# Patient Record
Sex: Male | Born: 1955 | Race: White | Hispanic: No | Marital: Married | Smoking: Current every day smoker
Health system: Southern US, Community
[De-identification: ages and names within clinical notes are randomized; demographics above are authoritative.]

## PROBLEM LIST (undated history)

## (undated) DIAGNOSIS — I2510 Atherosclerotic heart disease of native coronary artery without angina pectoris: Secondary | ICD-10-CM

## (undated) DIAGNOSIS — I77819 Aortic ectasia, unspecified site: Secondary | ICD-10-CM

## (undated) DIAGNOSIS — E785 Hyperlipidemia, unspecified: Secondary | ICD-10-CM

## (undated) DIAGNOSIS — I10 Essential (primary) hypertension: Secondary | ICD-10-CM

## (undated) DIAGNOSIS — E669 Obesity, unspecified: Secondary | ICD-10-CM

## (undated) HISTORY — DX: Atherosclerotic heart disease of native coronary artery without angina pectoris: I25.10

## (undated) HISTORY — DX: Aortic ectasia, unspecified site: I77.819

## (undated) HISTORY — DX: Obesity, unspecified: E66.9

## (undated) HISTORY — DX: Essential (primary) hypertension: I10

## (undated) HISTORY — PX: CARDIAC CATHETERIZATION: SHX172

## (undated) HISTORY — PX: KNEE SURGERY: SHX244

## (undated) HISTORY — DX: Hyperlipidemia, unspecified: E78.5

---

## 2001-02-05 ENCOUNTER — Emergency Department (HOSPITAL_COMMUNITY): Admission: EM | Admit: 2001-02-05 | Discharge: 2001-02-05 | Payer: Self-pay | Admitting: Emergency Medicine

## 2001-02-05 ENCOUNTER — Encounter: Payer: Self-pay | Admitting: Emergency Medicine

## 2003-11-01 ENCOUNTER — Ambulatory Visit (HOSPITAL_COMMUNITY): Admission: RE | Admit: 2003-11-01 | Discharge: 2003-11-01 | Payer: Self-pay | Admitting: Family Medicine

## 2005-04-17 ENCOUNTER — Ambulatory Visit (HOSPITAL_COMMUNITY): Admission: RE | Admit: 2005-04-17 | Discharge: 2005-04-17 | Payer: Self-pay | Admitting: Family Medicine

## 2006-10-03 ENCOUNTER — Ambulatory Visit: Payer: Self-pay | Admitting: Orthopedic Surgery

## 2008-11-15 ENCOUNTER — Ambulatory Visit: Payer: Self-pay | Admitting: Cardiology

## 2008-11-15 ENCOUNTER — Inpatient Hospital Stay (HOSPITAL_COMMUNITY): Admission: AD | Admit: 2008-11-15 | Discharge: 2008-11-17 | Payer: Self-pay | Admitting: Cardiology

## 2008-11-30 ENCOUNTER — Ambulatory Visit: Payer: Self-pay | Admitting: Cardiology

## 2009-01-17 DIAGNOSIS — E1169 Type 2 diabetes mellitus with other specified complication: Secondary | ICD-10-CM | POA: Insufficient documentation

## 2009-01-17 DIAGNOSIS — E785 Hyperlipidemia, unspecified: Secondary | ICD-10-CM | POA: Insufficient documentation

## 2009-01-17 DIAGNOSIS — I10 Essential (primary) hypertension: Secondary | ICD-10-CM | POA: Insufficient documentation

## 2009-01-17 DIAGNOSIS — E663 Overweight: Secondary | ICD-10-CM | POA: Insufficient documentation

## 2009-01-17 DIAGNOSIS — I2510 Atherosclerotic heart disease of native coronary artery without angina pectoris: Secondary | ICD-10-CM | POA: Insufficient documentation

## 2009-08-29 ENCOUNTER — Emergency Department (HOSPITAL_COMMUNITY): Admission: EM | Admit: 2009-08-29 | Discharge: 2009-08-29 | Payer: Self-pay | Admitting: Emergency Medicine

## 2010-02-15 ENCOUNTER — Ambulatory Visit: Payer: Self-pay | Admitting: Cardiology

## 2010-06-18 ENCOUNTER — Encounter: Payer: Self-pay | Admitting: Family Medicine

## 2010-08-16 LAB — DIFFERENTIAL
Basophils Absolute: 0.1 10*3/uL (ref 0.0–0.1)
Basophils Relative: 1 % (ref 0–1)
Eosinophils Absolute: 0.4 10*3/uL (ref 0.0–0.7)
Eosinophils Relative: 3 % (ref 0–5)
Lymphocytes Relative: 37 % (ref 12–46)
Lymphs Abs: 4.2 10*3/uL — ABNORMAL HIGH (ref 0.7–4.0)
Monocytes Absolute: 0.6 10*3/uL (ref 0.1–1.0)
Monocytes Relative: 5 % (ref 3–12)
Neutro Abs: 6.1 10*3/uL (ref 1.7–7.7)
Neutrophils Relative %: 54 % (ref 43–77)

## 2010-08-16 LAB — CBC
HCT: 42.6 % (ref 39.0–52.0)
Hemoglobin: 14.9 g/dL (ref 13.0–17.0)
MCHC: 35 g/dL (ref 30.0–36.0)
MCV: 87.2 fL (ref 78.0–100.0)
Platelets: 267 10*3/uL (ref 150–400)
RBC: 4.89 MIL/uL (ref 4.22–5.81)
RDW: 13.7 % (ref 11.5–15.5)
WBC: 11.3 10*3/uL — ABNORMAL HIGH (ref 4.0–10.5)

## 2010-08-16 LAB — PROTIME-INR: Prothrombin Time: 12.7 seconds (ref 11.6–15.2)

## 2010-08-18 ENCOUNTER — Encounter: Payer: Self-pay | Admitting: Gastroenterology

## 2010-08-25 ENCOUNTER — Encounter: Payer: Self-pay | Admitting: Gastroenterology

## 2010-08-27 ENCOUNTER — Other Ambulatory Visit: Payer: Self-pay | Admitting: Cardiology

## 2010-08-28 ENCOUNTER — Ambulatory Visit (INDEPENDENT_AMBULATORY_CARE_PROVIDER_SITE_OTHER): Payer: PRIVATE HEALTH INSURANCE | Admitting: Gastroenterology

## 2010-08-28 ENCOUNTER — Encounter: Payer: Self-pay | Admitting: Gastroenterology

## 2010-08-28 VITALS — BP 115/71 | HR 60 | Temp 97.9°F | Ht 72.0 in | Wt 233.5 lb

## 2010-08-28 DIAGNOSIS — R1032 Left lower quadrant pain: Secondary | ICD-10-CM

## 2010-08-28 NOTE — Progress Notes (Signed)
Referring Provider: Dr. Phillips Odor Primary Care Physician:  Colette Ribas, MD  Chief Complaint  Patient presents with  . Abdominal Pain    LLQ    HPI:  [name] is a 55 y.o. male here as a referral from Dr. Phillips Odor secondary to LLQ abdominal pain.  He has never had a colonoscopy. Reports LLQ, constant, "dull pain" that extends from level of umbilicus and radiates to his left groin. Complains of testicular discomfort with palpation; states main reason he is here is because he is in discomfort for several days after intercourse with his wife. He states this has been going on for a few years. Has BM 3-4X/week, occasionally constipated. Feels somewhat better after a BM, but LLQ pain returns shortly thereafter. Denies melena or brbpr. Does have decreased appetite X 1 year. Eats a few bites, then is done. Denies any fever/chills. Supposedly has appt with urology upcoming.   Past Medical History  Diagnosis Date  . Hypertension   . Obesity   . Hyperlipidemia   . CAD (coronary artery disease)   . Diabetes mellitus     Past Surgical History  Procedure Date  . Cardiac catheterization     one stent placed  . Knee surgery     Right    Current Outpatient Prescriptions  Medication Sig Dispense Refill  . aspirin 325 MG EC tablet Take 325 mg by mouth daily.        . clopidogrel (PLAVIX) 75 MG tablet Take 75 mg by mouth daily.        . metFORMIN (GLUCOPHAGE) 500 MG tablet Take 500 mg by mouth 2 (two) times daily with a meal.        . metoprolol tartrate (LOPRESSOR) 25 MG tablet Take 25 mg by mouth. TAKE HALF TABLET BY MOUTH 2 TIMES A DAY       . metoprolol-hydrochlorothiazide (LOPRESSOR HCT) 50-25 MG per tablet Take 1 tablet by mouth. TAKE ONE HALF 2 TIMES A DAY       . nitroGLYCERIN (NITROSTAT) 0.4 MG SL tablet Place 0.4 mg under the tongue every 5 (five) minutes as needed.        . pravastatin (PRAVACHOL) 40 MG tablet Take 40 mg by mouth at bedtime.        . ranitidine (ZANTAC) 150 MG  tablet Take 150 mg by mouth 2 (two) times daily.        . rosuvastatin (CRESTOR) 40 MG tablet Take 40 mg by mouth at bedtime.          Allergies as of 08/28/2010  . (No Known Allergies)    Family History  Problem Relation Age of Onset  . Diabetes Mother   . Cancer Father     UNSURE WHAT TYPE    History   Social History  . Marital Status: Married    Social History Main Topics  . Smoking status: Current Everyday Smoker -- 2.0 packs/day for 35 years    Types: Cigarettes  . Smokeless tobacco: Never Used  . Alcohol Use: 0.5 oz/week    1 drink(s) per week     one half of a fifth of crown on saturday nights  . Drug Use: No  . Sexually Active: No     erectile dysfunction   Review of Systems: Gen: Denies any fever, chills, sweats, anorexia CV: Denies chest pain, angina, palpitations, syncope, orthopnea Resp: Denies dyspnea at rest, wheezing, coughing up blood, and pleurisy. GI: SEE HPI GU :c/o urinary hesitancy, denies blood in  urine MS: Denies joint pain, limitation of movement, and swelling, stiffness, low back pain, extremity pain. Derm: Denies rash, itching, dry skin Psych: Denies depression, anxiety, memory loss Heme: Denies bruising, bleeding, and enlarged lymph nodes.  Physical Exam: BP 115/71  Pulse 60  Temp 97.9 F (36.6 C)  Ht 6' (1.829 m)  Wt 233 lb 8 oz (105.915 kg)  BMI 31.67 kg/m2 General:   Head:  Normocephalic and atraumatic. Eyes:  Sclera clear, no icterus.   Conjunctiva pink. Ears:  Normal auditory acuity. Nose:  No deformity, discharge,  or lesions. Mouth:  No deformity or lesions, dentition normal. Neck:  Supple; no masses or thyromegaly. Lungs:  Clear to auscultation bilaterally, without wheezes, rales, or rhonchi. No use of accessory muscles. No labored breathing Heart:  S1, S2 present. No murmurs, rubs, or gallops Abdomen: +BS, soft, mild tenderness to palpation LLQ. No rebound or guarding. No HSM noted.  Rectal:  Deferred until time of  colonoscopy.   Msk:  Symmetrical without gross deformities. Normal posture. Extremities:  Without clubbing or edema. Neurologic:  Alert and  oriented x4;  grossly normal neurologically. Skin:  Intact without significant lesions or rashes. Psych:  Alert and cooperative. Normal mood and affect.

## 2010-08-28 NOTE — Assessment & Plan Note (Signed)
55 year old Caucasian male with several year history of LLQ abdominal pain extending from left of umbilicus down to groin; significantly worsened after intercourse, lasting in severity for several days. Described as constant, "dull" pain. BM 3-4X per week without signs of melena or brbpr. Slight improvement in symptoms after BM; pain returns soon thereafter. No prior colonoscopy.  1. Will need screening colonoscopy in near future: the R/B/A have been discussed in detail; pt states understanding and desires to proceed. Hold metformin evening before procedure and morning of.  2. CT of abd/pelvis in next few days before colonoscopy to assess for other etiology as well; Cr will be drawn. Metformin to be held X 48 hours post scan.  3. High fiber diet, handout given. Miralax as needed daily for constipation.  4. Follow-up with urology and PCP for further management and recommendations regarding testicular discomfort.

## 2010-08-28 NOTE — Patient Instructions (Addendum)
Complete CT of your abdomen. We will call with results. Make sure you keep your urology appointment. Contact your primary care if you have questions of when this is. We have set you up for a colonoscopy. Eat a high fiber diet You may take miralax as needed daily for constipation.   CT scan April 3rd at 5:30 pm. Follow instructions on handout. STOP metformin April 3rd. You may resume metformin on April 5th.  Colonoscopy is scheduled for April 10th. Follow instructions on handout. Do not take metformin April 9th evening or April 10th morning. After colonoscopy, you may resume your routine medications.

## 2010-08-29 ENCOUNTER — Encounter (HOSPITAL_COMMUNITY): Payer: Self-pay

## 2010-08-29 ENCOUNTER — Ambulatory Visit (HOSPITAL_COMMUNITY)
Admission: RE | Admit: 2010-08-29 | Discharge: 2010-08-29 | Disposition: A | Discharge status: Home / Self Care | Payer: 59 | Source: Ambulatory Visit | Attending: Gastroenterology | Admitting: Gastroenterology

## 2010-08-29 DIAGNOSIS — R1032 Left lower quadrant pain: Secondary | ICD-10-CM

## 2010-08-29 MED ORDER — IOHEXOL 300 MG/ML  SOLN
100.0000 mL | Freq: Once | INTRAMUSCULAR | Status: AC | PRN
  Administered 2010-08-29: 100 mL via INTRAVENOUS

## 2010-08-29 NOTE — Progress Notes (Signed)
Discomfort in the LLQ after sex is not usually GI in nature. Agree with CT Scan and TCS. Follow up with urology.

## 2010-08-30 NOTE — Progress Notes (Signed)
Addendum: LLQ pain differentials to include: chronic prostatitis, functional abd pain, musculoskeletal abdominal wall pain. Less likely colon cancer or diverticulitis. Will proceed with CT to assess for other etiology, followed by TCS as pt has never undergone screening before. , and less likley colon cancer, or diverticulitis. Plan CT followed by TCS.

## 2010-09-04 ENCOUNTER — Ambulatory Visit (HOSPITAL_COMMUNITY)
Admission: RE | Admit: 2010-09-04 | Discharge: 2010-09-04 | Disposition: A | Discharge status: Home / Self Care | Payer: PRIVATE HEALTH INSURANCE | Source: Ambulatory Visit | Attending: Gastroenterology | Admitting: Gastroenterology

## 2010-09-04 ENCOUNTER — Encounter: Payer: PRIVATE HEALTH INSURANCE | Admitting: Gastroenterology

## 2010-09-04 ENCOUNTER — Other Ambulatory Visit: Payer: Self-pay | Admitting: Gastroenterology

## 2010-09-04 DIAGNOSIS — R109 Unspecified abdominal pain: Secondary | ICD-10-CM

## 2010-09-04 DIAGNOSIS — K648 Other hemorrhoids: Secondary | ICD-10-CM

## 2010-09-04 DIAGNOSIS — I10 Essential (primary) hypertension: Secondary | ICD-10-CM | POA: Insufficient documentation

## 2010-09-04 DIAGNOSIS — Z7982 Long term (current) use of aspirin: Secondary | ICD-10-CM | POA: Insufficient documentation

## 2010-09-04 DIAGNOSIS — E785 Hyperlipidemia, unspecified: Secondary | ICD-10-CM | POA: Insufficient documentation

## 2010-09-04 DIAGNOSIS — K5730 Diverticulosis of large intestine without perforation or abscess without bleeding: Secondary | ICD-10-CM | POA: Insufficient documentation

## 2010-09-04 DIAGNOSIS — D126 Benign neoplasm of colon, unspecified: Secondary | ICD-10-CM

## 2010-09-04 DIAGNOSIS — E119 Type 2 diabetes mellitus without complications: Secondary | ICD-10-CM | POA: Insufficient documentation

## 2010-09-04 DIAGNOSIS — Z1211 Encounter for screening for malignant neoplasm of colon: Secondary | ICD-10-CM | POA: Insufficient documentation

## 2010-09-04 DIAGNOSIS — Z79899 Other long term (current) drug therapy: Secondary | ICD-10-CM | POA: Insufficient documentation

## 2010-09-04 LAB — CBC
Hemoglobin: 14.7 g/dL (ref 13.0–17.0)
MCHC: 33.6 g/dL (ref 30.0–36.0)
MCHC: 33.8 g/dL (ref 30.0–36.0)
MCV: 90.9 fL (ref 78.0–100.0)
MCV: 91.4 fL (ref 78.0–100.0)
Platelets: 274 10*3/uL (ref 150–400)
RBC: 4.72 MIL/uL (ref 4.22–5.81)
RBC: 4.78 MIL/uL (ref 4.22–5.81)
RDW: 13.7 % (ref 11.5–15.5)
WBC: 11.1 10*3/uL — ABNORMAL HIGH (ref 4.0–10.5)
WBC: 14.8 10*3/uL — ABNORMAL HIGH (ref 4.0–10.5)

## 2010-09-04 LAB — BASIC METABOLIC PANEL
CO2: 24 mEq/L (ref 19–32)
Calcium: 8.6 mg/dL (ref 8.4–10.5)
Calcium: 8.8 mg/dL (ref 8.4–10.5)
Creatinine, Ser: 0.78 mg/dL (ref 0.4–1.5)
GFR calc Af Amer: >60 mL/min (ref >60)
GFR calc Af Amer: >60 mL/min (ref >60)
GFR calc non Af Amer: >60 mL/min (ref >60)
GFR calc non Af Amer: >60 mL/min (ref >60)
Glucose, Bld: 219 mg/dL — ABNORMAL HIGH (ref 70–99)
Glucose, Bld: 231 mg/dL — ABNORMAL HIGH (ref 70–99)
Potassium: 4 mEq/L (ref 3.5–5.1)
Sodium: 137 mEq/L (ref 135–145)

## 2010-09-04 LAB — GLUCOSE, CAPILLARY
Glucose-Capillary: 208 mg/dL — ABNORMAL HIGH (ref 70–99)
Glucose-Capillary: 126 mg/dL — ABNORMAL HIGH (ref 70–99)
Glucose-Capillary: 177 mg/dL — ABNORMAL HIGH (ref 70–99)
Glucose-Capillary: 191 mg/dL — ABNORMAL HIGH (ref 70–99)
Glucose-Capillary: 218 mg/dL — ABNORMAL HIGH (ref 70–99)
Glucose-Capillary: 218 mg/dL — ABNORMAL HIGH (ref 70–99)
Glucose-Capillary: 219 mg/dL — ABNORMAL HIGH (ref 70–99)
Glucose-Capillary: 244 mg/dL — ABNORMAL HIGH (ref 70–99)
Glucose-Capillary: 293 mg/dL — ABNORMAL HIGH (ref 70–99)

## 2010-09-04 LAB — DIFFERENTIAL
Eosinophils Absolute: 0.3 10*3/uL (ref 0.0–0.7)
Lymphocytes Relative: 27 % (ref 12–46)
Lymphs Abs: 3 10*3/uL (ref 0.7–4.0)
Monocytes Relative: 6 % (ref 3–12)
Neutro Abs: 7.1 10*3/uL (ref 1.7–7.7)
Neutrophils Relative %: 64 % (ref 43–77)

## 2010-09-04 LAB — TSH: TSH: 1.665 u[IU]/mL (ref 0.350–4.500)

## 2010-09-04 LAB — CARDIAC PANEL(CRET KIN+CKTOT+MB+TROPI)
CK, MB: 0.9 ng/mL (ref 0.3–4.0)
Relative Index: INVALID (ref 0.0–2.5)
Troponin I: 0.14 ng/mL — ABNORMAL HIGH (ref 0.00–0.06)
Troponin I: 0.16 ng/mL — ABNORMAL HIGH (ref 0.00–0.06)

## 2010-09-04 LAB — HEPARIN LEVEL (UNFRACTIONATED)
Heparin Unfractionated: 0.14 [IU]/mL — ABNORMAL LOW (ref 0.30–0.70)
Heparin Unfractionated: <0.1 IU/mL — ABNORMAL LOW (ref 0.30–0.70)

## 2010-09-04 LAB — PROTIME-INR
INR: 1 (ref 0.00–1.49)
Prothrombin Time: 12.8 seconds (ref 11.6–15.2)

## 2010-09-05 ENCOUNTER — Encounter: Payer: PRIVATE HEALTH INSURANCE | Admitting: Gastroenterology

## 2010-10-05 NOTE — Op Note (Signed)
  NAME:  [name], COBERLY NO.:  [id number]  MEDICAL RECORD NO.:  [id number]           PATIENT TYPE:  O  LOCATION:  DAYP                          FACILITY:  [facility]  PHYSICIAN:  Jonette Eva, M.D.     DATE OF BIRTH:  Jan 17, 1956  DATE OF PROCEDURE:  09/04/2010 DATE OF DISCHARGE:                              OPERATIVE REPORT   REFERRING PROVIDER:  Corrie Mckusick, MD  PROCEDURE:  Colonoscopy with snare cautery, cold snare, and cold forceps polypectomy.  INDICATION FOR EXAM:  [name] is a 55 year old male who presents for screening colonoscopy.  He has pain in his lower abdomen which is relieved with urination.  FINDINGS: 1. Several polyps removed in the ascending hepatic flexure,     transverse, descending, and sigmoid colon.  The polyps ranged from     3 mm to 6 mm.  They were removed via snare cautery, cold snare, and     cold forceps. 2. Frequent superficial diverticula seen in the descending and sigmoid     colon.  Otherwise, no masses, inflammatory changes, or AVMs seen. 3. Moderate internal hemorrhoids.  Otherwise, normal retroflex view of     the rectum.  DIAGNOSES: 1. Multiple colon polyps. 2. Mild left colon diverticulosis. 3. Moderate internal hemorrhoids.  RECOMMENDATIONS: 1. Hold Plavix, restart on September 08, 2010.  He may continue aspirin in     light of the fact that he has a drug-eluting stent that was placed     in June 2010. 2. He should avoid anti-inflammatory drugs for 7 days. 3. He should follow a high-fiber diet.  He was given handout on high-     fiber diet, polyps, diverticulosis, and hemorrhoids. 4. Screening colonoscopy will be based on final pathology.  MEDICATIONS: 1. Demerol 75 mg IV. 2. Versed 4 mg IV.  PROCEDURE TECHNIQUE:  Physical exam was performed.  Informed consent was obtained from the patient after explaining the benefits, risks, and alternatives to the procedure.  The patient was connected to the monitor and  placed in the left lateral position.  Continuous oxygen was provided by nasal cannula and IV medicine administered through an indwelling cannula.  After administration of sedation and rectal exam, the patient's rectum was intubated and the scope was advanced under direct visualization to the cecum.  A 4-mm sessile sigmoid colon polyp was removed via snare cautery on the way in.  A 6-mm sessile ascending colon polyp was removed and the base required cautery.  Otherwise, a sessile ascending colon polypoid lesion was removed via cold forceps and the base was cauterized.  The scope was removed slowly by carefully examining the color, texture, anatomy, and integrity of the mucosa on the way out.  The patient was recovered in endoscopy and discharged home in satisfactory condition.   PATH: SIMPLE ADENOMAS AND HYPERPLASTIC POLYPS  Jonette Eva, M.D.     SF/MEDQ  D:  09/04/2010  T:  09/04/2010  Job:  [id number]  cc:   Corrie Mckusick, M.D. Fax: [phone]  Electronically Signed by Jonette Eva M.D. on 10/05/2010 04:52:45 PM

## 2010-10-10 NOTE — Cardiovascular Report (Signed)
NAMETYMIR, TERRAL NO.:  [id number]   MEDICAL RECORD NO.:  [id number]           PATIENT TYPE:   LOCATION:                                 FACILITY:   PHYSICIAN:  Arturo Morton. Riley Kill, MD, FACCDATE OF BIRTH:  May 02, 1956   DATE OF PROCEDURE:  11/16/2008  DATE OF DISCHARGE:                            CARDIAC CATHETERIZATION   INDICATIONS:  [name] is a 55 year old gentleman, who is a smoker.  He also has diabetes.  He presents with an acute coronary syndrome.  Enzymes were borderline positive without electrocardiographic  abnormalities.  Cardiac catheterization was recommended.  Risks,  benefits, and alternatives were discussed with the patient in detail.  He consented to proceed.   PROCEDURES:  1. Left heart catheterization.  2. Selective coronary arteriography.  3. Selective left ventriculography.  4. Percutaneous stenting of the circumflex coronary artery.   DESCRIPTION OF THE PROCEDURE:  The patient was brought to the  catheterization laboratory following informed consent and prepped and  draped in the usual fashion.  Through an anterior puncture, the right  femoral artery was entered and a 5-French sheath was placed.  Diagnostic  views of the left and right coronary arteries were obtained.  Central  aortic and left ventricular pressures were obtained.  Ventriculography  was performed in the RAO projection.  I then reviewed the films with the  patient in detail.  We then looked extensively for his family.  They  could not be found.  The patient did want to proceed with percutaneous  intervention.  With this, the plan was to proceed.  Of note, we used a  diagnostic 3.5 to gain access to the left main.  We attempted to use a  3.0 XB guide to perform the procedure, but despite manipulation, we were  unable to see this in the left main.  A 3.0 Judkins interventional  catheter was then utilized, a 5-French sheath was exchanged for a 6-  [place] sheath, and  bivalirudin was given according to protocol, and ACT  was checked and found to be appropriate for intervention.  We did use a  Luge wire and were able to manipulate this down the vessel.  Following  this, predilatation was done with a 2.25 x 12 Apex balloon.  There was a  fair amount improvement in the artery, and it appear to be a fairly soft  plaque.  We then passed a 2.5 x 15 XIENCE V drug-eluting stent.  We were  able to get around the first bend, with a long left main and sharp angle  turn by just very slow gentle manipulation and it gradually worked its  way around.  We were unable to get it down past the second bend, but  this was accomplished using a buddy wire.  A BMW wire was passed into  the distal vessel alongside the first wire, and we were able to get the  stent smoothly down the artery.  The buddy wire was then removed.  Following this, the vessel was stented successfully using a 15  atmosphere inflation.  Post dilatation was then  done with a 3.0 x 8  Quantum Apex balloon.  Pressures were done as high as 10 atmospheres  throughout the course of the stent making sure to stay inside the very  edges as this was not ideal for treatment of a possible edge problem.  Final views were subsequently obtained, and the wire removed.  I  reviewed the case with the patient's family.  There were no major  complications.   The femoral sheath was sewn in the place.  He was taken to the holding  area in satisfactory clinical condition.   HEMODYNAMIC DATA:  Central aortic pressure was 117/69, mean 88, LV  pressure was 138/9.  There was no gradient on pullback across the aortic  valve.   ANGIOGRAPHIC DATA:  1. The left main coronary artery was free of critical disease.  2. The LAD courses to the apex.  There is about a 20% area of mid LAD      abnormality.  This crosses over the septal perforator.  The LAD has      mild luminal irregularity but was without critical narrowing.  3. There is  a tiny ramus intermedius that has about 20% ostial      proximal narrowing.  4. The circumflex comes off at a 90-degree bend from the left main,      which is long.  This is best noted in the LAO caudal view.  There      is about 30% narrowing just proximal to the area where there is a      second 90-degree bend.  Just after a small marginal branch, there      is a 95% focal stenosis, which is reduced to 0% residual luminal      narrowing.  The distal vessel was without critical disease.  5. The right coronary artery demonstrates mild luminal irregularity in      its midportion with about 20-30% eccentric plaquing.  6. Ventriculography in the RAO projection reveals an estimated      ejection fraction of 55% with perhaps mild inferior hypokinesis.   CONCLUSIONS:  1. Preserved overall left ventricular function with minimal wall      motion abnormality.  2. Subtotal occlusion of the circumflex coronary artery in its      midportion with successful percutaneous stenting using a drug-      eluting platform with appropriate post dilatation.  3. Scattered irregularities.  4. Tobacco abuse.  5. Diabetes mellitus.   DISPOSITION:  The patient will be treated medically.  Aggressive risk  factor reduction would be recommended.      Arturo Morton. Riley Kill, MD, [facility]  Electronically Signed     TDS/MEDQ  D:  11/16/2008  T:  11/17/2008  Job:  [id number]   cc:   Patrica Duel, M.D.  Learta Codding, MD,FACC  Dr. Elby Beck  CV Laboratory

## 2010-10-10 NOTE — Assessment & Plan Note (Signed)
[facility]                          [facility] OFFICE NOTE   NAME:[name]                       MRN:          [id number]  DATE:11/30/2008                            DOB:          10-24-1955    PRIMARY CARE PHYSICIAN:  Patrica Duel, MD   REASON FOR PRESENTATION:  Evaluate the patient with coronary artery  disease, status post stenting.   HISTORY OF PRESENT ILLNESS:  The patient presented with unstable angina  and has slightly elevated troponin on November 15, 2008.  He underwent  cardiac catheterization and was found to have LAD 20% stenosis,  circumflex 30% followed by 95% stenosis.  The right coronary artery had  20-30% stenosis.  He had a drug-eluting stent placed to the circumflex.  His EF was 55%.  Since that time, he has had none of the dyspnea or  chest or arm discomfort that was his angina.  He has been back at work  though not quite as vigorous as before.  With his level of activity, he  is not describing any chest, arm, or neck discomfort.  He is not having  any shortness of breath.  He is not having any PND or orthopnea.  He has  had no palpitations, presyncope, or syncope.  He is not smoking.  He is  trying to eat better.   PAST MEDICAL HISTORY:  Coronary artery disease as described, diabetes  mellitus, dyslipidemia, previous tobacco use, and gastroesophageal  reflux disease.   PAST SURGICAL HISTORY:  None.   ALLERGIES:  None.   MEDICATIONS:  1. Aspirin 325 mg daily.  2. Plavix 75 mg daily.  3. Lopressor 12.5 mg b.i.d.  4. Pravachol 40 mg daily.  5. Metformin 500 mg b.i.d.   REVIEW OF SYSTEMS:  As stated in the HPI, otherwise negative for all  other systems.   PHYSICAL EXAMINATION:  GENERAL:  The patient is pleasant and in no  distress.  VITAL SIGNS:  Blood pressure is 133/86, heart rate 52 and regular, and  weight is 236 pounds.  HEENT:  Eyes are unremarkable; pupils equal, round, and reactive to  light; fundi  not visualized; oral mucosa unremarkable.  NECK:  No jugular venous distention at 45 degrees; carotid upstroke  brisk and symmetric; no bruits, no thyromegaly.  LYMPHATICS:  No cervical, axillary, or inguinal adenopathy.  LUNGS:  Clear to auscultation bilaterally.  BACK:  No costovertebral angle tenderness.  CHEST:  Unremarkable.  HEART:  PMI not displaced or sustained; S1 and S2 within normal limits;  no S3, no S4; no clicks, no rubs, and no murmurs.  ABDOMEN:  Obese; positive bowel sounds; normal in frequency and pitch;  no bruits, no rebound; no guarding, no midline pulsatile mass; no  hepatomegaly, no splenomegaly.  SKIN:  No rashes, no nodules.  EXTREMITIES:  2+ pulses throughout; no edema, no cyanosis, no clubbing.  NEUROLOGIC:  Oriented to person, place, and time; cranial nerves II  through XII are grossly intact; motor grossly intact.   ASSESSMENT AND PLAN:  1. Coronary artery disease.  The patient is having no ongoing  symptoms.  No further cardiovascular testing is suggested.  He will      continue with aggressive risk reduction.  I discussed this strategy      at a great length with his wife and with the patient.  2. Dyslipidemia.  I reviewed his lipids from the hospital.  His total      cholesterol at [facility] was 264, HDL 25, and LDL of 196.  He is on      pravastatin 40.  Unfortunately, this will never get him to target.      I am going to switch him to Crestor 20 mg daily, which still might      not achieve our target.  Cost unfortunately is a consideration, but      he will try to pay for this medication.  We discussed diet as well.  3. Diabetes.  I have encouraged him to follow with Dr. Nobie Putnam for      tight diabetes control.  4. Hypertension.  Blood pressure is controlled and he will continue      the meds as listed.  5. Tobacco.  I pled him being on cigarettes and encouraged continued      abstinence.  6. Obesity.  He will work on diet and exercise and we  discussed an      exercise regimen.   FOLLOWUP:  He should followup in 8 weeks with a lipid and liver profile  and a followup appointment.     Rollene Rotunda, MD, [facility]  Electronically Signed    JH/MedQ  DD: 11/30/2008  DT: 12/01/2008  Job #: [id number]   cc:   Patrica Duel, M.D.

## 2010-10-10 NOTE — Discharge Summary (Signed)
NAMEHELEN, [name] NO.:  [id number]   MEDICAL RECORD NO.:  [id number]          PATIENT TYPE:  INP   LOCATION:  2507                         FACILITY:  [facility]   PHYSICIAN:  [name]. Riley Kill, MD, FACCDATE OF BIRTH:  04/11/1956   DATE OF ADMISSION:  11/15/2008  DATE OF DISCHARGE:  11/17/2008                               DISCHARGE SUMMARY   PRIMARY CARDIOLOGIST:  Learta Codding, MD, [facility] [place]   PRIMARY CARE [name]:  Dr. Nobie Putnam.   DISCHARGE DIAGNOSIS:  Non-ST-segment elevation myocardial infarction.   SECONDARY DIAGNOSES:  1. Coronary artery disease status post successful percutaneous      coronary intervention and stenting of the proximal left circumflex      with placement of a 2.5 x 15-mm Xience V drug-eluting stent on November 16, 2008.  2. Hyperlipidemia.  3. Type 2 diabetes mellitus.  4. Ongoing tobacco abuse.  5. Gastroesophageal reflux disease.   ALLERGIES:  No known drug allergies.   PROCEDURES:  Left heart cardiac catheterization revealing a 95% stenosis  in the mid left circumflex with otherwise nonobstructive disease and an  EF of 55%.  PCI and stenting of the left circumflex with placement of a  2.5 x 15-mm Xience V drug-eluting stent.   HISTORY OF PRESENT ILLNESS:  A 55 year old Caucasian male without prior  history of coronary artery disease who was in his usual state of health  until several weeks prior to admission when he began to experience  significant exertional dyspnea as well as bilateral arm pain and chest  tightness.  On the evening of June 20, the patient developed recurrent  symptoms while lying in bed with shortness of breath, diaphoresis, and  nausea.  He was driven to the [facility] Emergency Room where he  was treated with 3 sublingual nitroglycerin and morphine with relief of  symptoms.  He was found to have an elevated troponin of 0.16 and he was  subsequently seen by [facility] at [facility] with decision made  to  transfer to [facility] for evaluation and catheterization.   HOSPITAL COURSE:  The patient peaked his CK at 57, MB of 1.2, and  troponin I at 0.16.  He underwent left heart cardiac catheterization on  June 22 revealing 95% stenosis in the proximal left circumflex and  otherwise nonobstructive disease with normal LV function.  The  circumflex was stented with a 2.5 x 15-mm Xience V drug-eluting stent.  The patient tolerated the procedure well and postprocedure has been  counseled on importance of compliance, diabetes management, tobacco  cessation, and weight loss.  The patient plans to quit smoking.  He had  previously been noncompliant with his metformin therapy which was  prescribed by Dr. Nobie Putnam and we stressed the importance of appropriate  diabetes management.  Postprocedure, [name] has been ambulating  without recurrent symptoms or limitation.  He will be discharged home  today in good condition.   DISCHARGE LABORATORY DATA:  Hemoglobin 13.9, hematocrit 41.1, WBC 10.3,  and platelets 262.  INR 1.0.  Sodium 135, potassium 4.0, chloride  103,  CO2 of 24, BUN 10, creatinine 0.78, glucose 231, and calcium 8.6.  CK  55, MB 0.9, and troponin I 0.14.  TSH 1.665.   DISPOSITION:  The patient will be discharged home today in good  condition.   FOLLOWUP PLANS AND APPOINTMENTS:  We have arranged for him to follow up  in our [place] office on July 6 at 11:15 a.m.  At that initial visit, it  will be with Dr. Antoine Poche, although he will subsequently follow up with  Dr. Andee Lineman.  He is to follow up with Dr. Nobie Putnam in 1-2 weeks.   DISCHARGE MEDICATIONS:  We have tried to keep things generic wherever  possible as the patient pays out of pocket for his medication.  1. Aspirin 325 mg daily.  2. Plavix 75 mg daily.  3. Lopressor 25 mg half a tab b.i.d.  4. Pravachol 40 mg at bedtime.  5. Nitroglycerin 0.4 mg sublingual p.r.n chest pain.  6. Metformin 500 mg b.i.d., to be resumed November 18, 2008.  7. Zantac 150 mg b.i.d. p.r.n.   OUTSTANDING LABORATORY STUDIES:  None.   Duration of discharge encounter 60 minutes including physician time.      [name], ANP      [name]. Riley Kill, MD, [facility]  Electronically Signed    CB/MEDQ  D:  11/17/2008  T:  11/18/2008  Job:  [id number]   cc:   Dr. Nobie Putnam

## 2010-11-02 ENCOUNTER — Telehealth: Payer: Self-pay

## 2010-11-02 NOTE — Telephone Encounter (Signed)
Pt's wife called for pt requesting results of his colonoscopy path from 09/04/2010. I looked path up in e-chart and told her that he did not have cancer and that Dr. Darrick Penna would review and give more info next week, she will be off for a few days.

## 2010-11-02 NOTE — Telephone Encounter (Signed)
LMOM for pt to call. 

## 2010-11-02 NOTE — Telephone Encounter (Signed)
Please call pt. He had a simple polyp removed. Due to a computer problems his results were delayed. TCS IN 10 years. High fiber diet.

## 2010-11-03 NOTE — Telephone Encounter (Signed)
Pt informed

## 2010-12-14 ENCOUNTER — Emergency Department (HOSPITAL_COMMUNITY)
Admission: EM | Admit: 2010-12-14 | Discharge: 2010-12-15 | Disposition: A | Discharge status: Home / Self Care | Payer: PRIVATE HEALTH INSURANCE | Attending: Emergency Medicine | Admitting: Emergency Medicine

## 2010-12-14 ENCOUNTER — Encounter (HOSPITAL_COMMUNITY): Payer: Self-pay | Admitting: *Deleted

## 2010-12-14 DIAGNOSIS — I2510 Atherosclerotic heart disease of native coronary artery without angina pectoris: Secondary | ICD-10-CM | POA: Insufficient documentation

## 2010-12-14 DIAGNOSIS — E785 Hyperlipidemia, unspecified: Secondary | ICD-10-CM | POA: Insufficient documentation

## 2010-12-14 DIAGNOSIS — Z7982 Long term (current) use of aspirin: Secondary | ICD-10-CM | POA: Insufficient documentation

## 2010-12-14 DIAGNOSIS — M546 Pain in thoracic spine: Secondary | ICD-10-CM | POA: Insufficient documentation

## 2010-12-14 DIAGNOSIS — I10 Essential (primary) hypertension: Secondary | ICD-10-CM | POA: Insufficient documentation

## 2010-12-14 DIAGNOSIS — M549 Dorsalgia, unspecified: Secondary | ICD-10-CM

## 2010-12-14 DIAGNOSIS — F17200 Nicotine dependence, unspecified, uncomplicated: Secondary | ICD-10-CM | POA: Insufficient documentation

## 2010-12-14 DIAGNOSIS — E119 Type 2 diabetes mellitus without complications: Secondary | ICD-10-CM | POA: Insufficient documentation

## 2010-12-14 DIAGNOSIS — Z79899 Other long term (current) drug therapy: Secondary | ICD-10-CM | POA: Insufficient documentation

## 2010-12-14 LAB — DIFFERENTIAL
Basophils Absolute: 0.1 10*3/uL (ref 0.0–0.1)
Basophils Relative: 1 % (ref 0–1)
Eosinophils Absolute: 0.3 10*3/uL (ref 0.0–0.7)
Neutro Abs: 6.9 10*3/uL (ref 1.7–7.7)
Neutrophils Relative %: 57 % (ref 43–77)

## 2010-12-14 LAB — URINALYSIS, ROUTINE W REFLEX MICROSCOPIC
Glucose, UA: 100 mg/dL — AB
Hgb urine dipstick: NEGATIVE
Ketones, ur: NEGATIVE mg/dL
Leukocytes, UA: NEGATIVE
pH: 6 (ref 5.0–8.0)

## 2010-12-14 LAB — COMPREHENSIVE METABOLIC PANEL
ALT: 14 U/L (ref 0–53)
AST: 13 U/L (ref 0–37)
Albumin: 3.3 g/dL — ABNORMAL LOW (ref 3.5–5.2)
Alkaline Phosphatase: 92 U/L (ref 39–117)
BUN: 15 mg/dL (ref 6–23)
Potassium: 3.8 mEq/L (ref 3.5–5.1)
Sodium: 135 mEq/L (ref 135–145)
Total Protein: 6.8 g/dL (ref 6.0–8.3)

## 2010-12-14 LAB — CBC
MCH: 30.1 pg (ref 26.0–34.0)
MCHC: 34.1 g/dL (ref 30.0–36.0)
Platelets: 303 10*3/uL (ref 150–400)
RDW: 14.3 % (ref 11.5–15.5)

## 2010-12-14 NOTE — ED Notes (Signed)
Lt flank pain for 2 days, no n,v,d.

## 2010-12-14 NOTE — ED Notes (Signed)
Attempted to start iv inpatient but he refused

## 2010-12-14 NOTE — ED Notes (Signed)
Patient is comfortable and family is fine was given a cup of coffee.

## 2010-12-15 MED ORDER — HYDROMORPHONE HCL 1 MG/ML IJ SOLN
1.0000 mg | Freq: Once | INTRAMUSCULAR | Status: AC
  Administered 2010-12-15: 1 mg via INTRAMUSCULAR
  Filled 2010-12-15: qty 1

## 2010-12-15 MED ORDER — ONDANSETRON HCL 4 MG PO TABS
4.0000 mg | ORAL_TABLET | Freq: Once | ORAL | Status: AC
  Administered 2010-12-15: 4 mg via ORAL
  Filled 2010-12-15: qty 1

## 2010-12-15 MED ORDER — PREDNISONE 20 MG PO TABS
60.0000 mg | ORAL_TABLET | Freq: Once | ORAL | Status: AC
  Administered 2010-12-15: 60 mg via ORAL
  Filled 2010-12-15: qty 3

## 2010-12-15 MED ORDER — CYCLOBENZAPRINE HCL 10 MG PO TABS: 10.0000 mg | ORAL_TABLET | Freq: Two times a day (BID) | ORAL | Status: AC | PRN

## 2010-12-15 MED ORDER — PREDNISONE 10 MG PO TABS: 20.0000 mg | ORAL_TABLET | Freq: Every day | ORAL | Status: AC

## 2010-12-15 MED ORDER — HYDROCODONE-ACETAMINOPHEN 5-325 MG PO TABS: 1.0000 | ORAL_TABLET | ORAL | Status: AC | PRN

## 2010-12-15 NOTE — ED Provider Notes (Signed)
History     Chief Complaint  Patient presents with  . 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 Worse tonight when bathing noticed pain with palpation to the left flank, back and down his buttock and thigh. Denies fever, chills, nausea, vomiting, weakness. Legs feel tired when he walks.  Patient is a 55 y.o. male presenting with back pain. The history is provided by the patient.  Back Pain  The current episode started more than 1 week ago. The problem occurs constantly. The problem has been gradually worsening. The pain is associated with no known injury. The pain is present in the thoracic spine (left flank). The quality of the pain is described as stabbing, shooting, aching and burning. Radiates to: radiate through L buttock to back of thigh. The pain is moderate. The pain is the same all the time. Pertinent negatives include no chest pain, no fever, no numbness, no abdominal swelling, no bowel incontinence, no perianal numbness, no dysuria, no leg pain, no paresthesias, no tingling and no weakness. The treatment provided no relief.    Past Medical History  Diagnosis Date  . Hypertension   . Obesity   . Hyperlipidemia   . CAD (coronary artery disease)   . Diabetes mellitus     on metformin    Past Surgical History  Procedure Date  . Cardiac catheterization     one stent placed  . Knee surgery     Right    Family History  Problem Relation Age of Onset  . Diabetes Mother     History  Substance Use Topics  . Smoking status: Current Everyday Smoker -- 2.0 packs/day for 35 years    Types: Cigarettes  . Smokeless tobacco: Never Used  . Alcohol Use: 0.5 oz/week    1 drink(s) per week     one half of a fifth of crown on saturday nights      Review of Systems  Constitutional: Negative for fever.  Cardiovascular: Negative for chest pain.  Gastrointestinal: Negative for bowel incontinence.  Genitourinary: Negative  for dysuria.  Musculoskeletal: Positive for back pain.  Neurological: Negative for tingling, weakness, numbness and paresthesias.  All other systems reviewed and are negative.    Physical Exam  BP 143/70  Pulse 60  Temp(Src) 98.8 F (37.1 C) (Oral)  Resp 20  Ht 6' (1.829 m)  Wt 235 lb (106.595 kg)  BMI 31.87 kg/m2  SpO2 97%  Physical Exam  Nursing note and vitals reviewed. Constitutional: He is oriented to person, place, and time. He appears well-developed and well-nourished.  HENT:  Head: Normocephalic.  Right Ear: External ear normal.  Left Ear: External ear normal.  Mouth/Throat: Oropharynx is clear and moist.  Eyes: Conjunctivae and EOM are normal. Pupils are equal, round, and reactive to light.  Neck: Normal range of motion. Neck supple.  Cardiovascular: Normal heart sounds and intact distal pulses.   Pulmonary/Chest: Effort normal and breath sounds normal.  Abdominal: Soft.  Musculoskeletal:       No cva tenderness. Mild tenderness to palpation over left paraspinal muscle in lumbar region. Straight leg raise positive on the left.  Neurological: He is alert and oriented to person, place, and time. He has normal reflexes.  Skin: Skin is warm and dry.    ED Course  Procedures  MDM       Nicoletta Dress. Colon Branch, MD 12/15/10 [phone]

## 2010-12-15 NOTE — Progress Notes (Signed)
Patient states back pain has improved. Ready for discharge.

## 2012-02-29 ENCOUNTER — Other Ambulatory Visit (HOSPITAL_COMMUNITY): Payer: Self-pay | Admitting: Family Medicine

## 2012-02-29 ENCOUNTER — Ambulatory Visit (HOSPITAL_COMMUNITY)
Admission: RE | Admit: 2012-02-29 | Discharge: 2012-02-29 | Disposition: A | Discharge status: Home / Self Care | Payer: Managed Care, Other (non HMO) | Source: Ambulatory Visit | Attending: Family Medicine | Admitting: Family Medicine

## 2012-02-29 DIAGNOSIS — I10 Essential (primary) hypertension: Secondary | ICD-10-CM

## 2012-02-29 DIAGNOSIS — M25559 Pain in unspecified hip: Secondary | ICD-10-CM | POA: Insufficient documentation

## 2012-02-29 DIAGNOSIS — R05 Cough: Secondary | ICD-10-CM | POA: Insufficient documentation

## 2012-02-29 DIAGNOSIS — R059 Cough, unspecified: Secondary | ICD-10-CM | POA: Insufficient documentation

## 2012-02-29 DIAGNOSIS — R0602 Shortness of breath: Secondary | ICD-10-CM | POA: Insufficient documentation

## 2012-02-29 DIAGNOSIS — M25552 Pain in left hip: Secondary | ICD-10-CM

## 2012-02-29 DIAGNOSIS — R0989 Other specified symptoms and signs involving the circulatory and respiratory systems: Secondary | ICD-10-CM | POA: Insufficient documentation

## 2012-02-29 DIAGNOSIS — R079 Chest pain, unspecified: Secondary | ICD-10-CM | POA: Insufficient documentation

## 2012-04-17 ENCOUNTER — Ambulatory Visit (HOSPITAL_COMMUNITY)
Admission: RE | Admit: 2012-04-17 | Discharge: 2012-04-17 | Disposition: A | Discharge status: Home / Self Care | Payer: Managed Care, Other (non HMO) | Source: Ambulatory Visit | Attending: Internal Medicine | Admitting: Internal Medicine

## 2012-04-17 DIAGNOSIS — I2510 Atherosclerotic heart disease of native coronary artery without angina pectoris: Secondary | ICD-10-CM | POA: Insufficient documentation

## 2012-04-17 DIAGNOSIS — Z7982 Long term (current) use of aspirin: Secondary | ICD-10-CM | POA: Insufficient documentation

## 2012-04-17 DIAGNOSIS — Z79899 Other long term (current) drug therapy: Secondary | ICD-10-CM | POA: Insufficient documentation

## 2012-04-17 DIAGNOSIS — E785 Hyperlipidemia, unspecified: Secondary | ICD-10-CM | POA: Insufficient documentation

## 2012-04-17 DIAGNOSIS — E119 Type 2 diabetes mellitus without complications: Secondary | ICD-10-CM | POA: Insufficient documentation

## 2012-04-17 DIAGNOSIS — R0989 Other specified symptoms and signs involving the circulatory and respiratory systems: Secondary | ICD-10-CM

## 2012-04-17 DIAGNOSIS — F17200 Nicotine dependence, unspecified, uncomplicated: Secondary | ICD-10-CM | POA: Insufficient documentation

## 2012-04-17 DIAGNOSIS — I10 Essential (primary) hypertension: Secondary | ICD-10-CM | POA: Insufficient documentation

## 2012-04-17 NOTE — Progress Notes (Signed)
LEA Duplex Completed.  Mithran Strike D    

## 2012-04-17 NOTE — Progress Notes (Signed)
Carotid Duplex Completed. [name] D  

## 2012-04-18 NOTE — Progress Notes (Signed)
LEA Doppler completed.

## 2012-04-18 NOTE — Progress Notes (Signed)
Abdominal aorta duplex completed. [name]

## 2012-04-20 ENCOUNTER — Other Ambulatory Visit (HOSPITAL_COMMUNITY): Payer: Self-pay | Admitting: *Deleted

## 2012-04-21 ENCOUNTER — Other Ambulatory Visit (HOSPITAL_COMMUNITY): Payer: Self-pay | Admitting: Vascular Surgery

## 2012-05-27 ENCOUNTER — Encounter: Payer: Self-pay | Admitting: Orthopedic Surgery

## 2012-05-27 ENCOUNTER — Ambulatory Visit (INDEPENDENT_AMBULATORY_CARE_PROVIDER_SITE_OTHER): Payer: Managed Care, Other (non HMO)

## 2012-05-27 ENCOUNTER — Ambulatory Visit (INDEPENDENT_AMBULATORY_CARE_PROVIDER_SITE_OTHER): Payer: Managed Care, Other (non HMO) | Admitting: Orthopedic Surgery

## 2012-05-27 VITALS — BP 110/60 | Ht 72.0 in | Wt 221.0 lb

## 2012-05-27 DIAGNOSIS — M4800 Spinal stenosis, site unspecified: Secondary | ICD-10-CM

## 2012-05-27 DIAGNOSIS — M549 Dorsalgia, unspecified: Secondary | ICD-10-CM

## 2012-05-27 DIAGNOSIS — M5136 Other intervertebral disc degeneration, lumbar region: Secondary | ICD-10-CM | POA: Insufficient documentation

## 2012-05-27 DIAGNOSIS — M7540 Impingement syndrome of unspecified shoulder: Secondary | ICD-10-CM | POA: Insufficient documentation

## 2012-05-27 DIAGNOSIS — M51369 Other intervertebral disc degeneration, lumbar region without mention of lumbar back pain or lower extremity pain: Secondary | ICD-10-CM | POA: Insufficient documentation

## 2012-05-27 DIAGNOSIS — M5137 Other intervertebral disc degeneration, lumbosacral region: Secondary | ICD-10-CM

## 2012-05-27 MED ORDER — HYDROCODONE-ACETAMINOPHEN 7.5-325 MG PO TABS: 1.0000 | ORAL_TABLET | Freq: Four times a day (QID) | ORAL | Status: DC | PRN

## 2012-05-27 NOTE — Progress Notes (Signed)
Patient ID: [name], male   DOB: 1955-09-10, 56 y.o.   MRN: [id number] Chief Complaint  Patient presents with  . Hip Pain    Hip and shoulder pain referred by Dr. 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 He had MRIs in 2005 of his neck and bar spine which showed disc disease and disc protrusions with some nerve root impingement but he was not given any significant treatment that he can remember. He comes in today complaining of 9/10 stabbing pain with his left lower extremity giving way. He says he can only walk for a sore period of time and then his legs feel tired and want to give out. He denies numbness or tingling.  He's having some left shoulder pain with decreased range of motion and painful for elevation as well as pain when lying on his left side.  He also has radicular pain running from the cervical spine into his left hand  Review of systems he complains of redness and watering of his eyes wheezing and cough constipation joint pain anxiety depression easy bruising and his other systems were normal  Physical Exam Vital signs: BP 110/60  Ht 6' (1.829 m)  Wt 221 lb (100.245 kg)  BMI 29.97 kg/m2   GENERAL: normal development   CDV: pulses are normal   Skin: normal  Lymph: nodes were not palpable/normal  Psychiatric: awake, alert and oriented  Neuro: normal sensation  MSK  Gait: Today ambulates fine no problems no assistive devices  Lower extremity exam  The right and left lower extremity:  Inspection and palpation revealed no tenderness or abnormality in alignment in the lower extremities. Range of motion is full.  Strength is grade 5.  All joints are stable.  Upper extremity exam   The right upper extremity exam   Inspection and palpation revealed no abnormalities in the right upper extremity   Range of motion is full without contracture.  Motor exam is normal  with grade 5 strength.  The joints are fully reduced without subluxation.  There is no atrophy or tremor and muscle tone is normal.  All joints are stable.  The left upper extremity is nontender to palpation no forming. Active range of motion is diminished passive range of motion is normal with pain at 120 of forward elevation. The shoulder joint is stable. Rotator cuff strength normal. Skin normal. Provocative test of impingement are positive.  Previous MRI of the cervical spine and lumbar spine are noted below: T12-L1: Minimal disc bulge.  L1-2: Small broad based posterior disc herniation, indenting thecal sac, but causing no nerve root compression. Foramina patent.  L2-3: Diffuse disc bulge flattening ventral aspect of thecal sac. Bilateral facet hypertrophy. No focal disc herniation or nerve root compression. Foramina patent.  L3-4: Diffuse disc bulge flattening thecal sac. Facet hypertrophy and ligamentum flavum thickening bilaterally. Question small left foraminal disc protrusion on sagittal images, with disc appearing to abut the exiting left L3 root. L3 does not appear flattened or displaced.  L4-5: Diffuse disc bulge. Mild facet degenerative changes. No focal disc herniation or nerve root compression.  L5-S1: Broad based posterior disc herniation extending slightly greater to the left than right into the medial aspects of the neural foramina. Mild compression and displacement of the left S1 nerve root. Clinical correlation for symptoms in left S1 distribution recommended. L5 roots exit without compression.  IMPRESSION  Small  central disc herniation L5-S1 with mass effect upon left S1 root. Clinical correlation for symptoms left S1 distribution recommended. Question tiny foraminal disc herniation left L3-4 though no mass effect upon exiting L3 roots seen.  Broad based posterior disc herniation L1-2 causing no neural compression.  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 Given history  of left upper extremity radiculopathy, most notable finding is on the left at C4-5, where there is foraminal narrowing. Lesser degree of left foraminal narrowing is seen at C5-6. 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    X-ray today of lumbar spine shows minimal degenerative disc changes.  The patient has spinal stenosis in the lumbar spine He also has impingement of left shoulder He also has cervical degenerative disc disease  I have treated him for his left shoulder problem with an injection in home exercise program he should respond well to this.  He will need further management with a neurosurgeon in his primary care physician regarding his chronic back pain; I did give him some pain medication to last him for at least 6 weeks while his referrals are being arranged.

## 2012-05-27 NOTE — Patient Instructions (Addendum)
Refer patient to NeuroSurgery and back to his primary care doctor for spinal stenosis   You have received a steroid shot. 15% of patients experience increased pain at the injection site with in the next 24 hours. This is best treated with ice and tylenol extra strength 2 tabs every 8 hours. If you are still having pain please call the office.   Impingement Syndrome, Rotator Cuff, Bursitis with Rehab Impingement syndrome is a condition that involves inflammation of the tendons of the rotator cuff and the subacromial bursa, that causes pain in the shoulder. The rotator cuff consists of four tendons and muscles that control much of the shoulder and upper arm function. The subacromial bursa is a fluid filled sac that helps reduce friction between the rotator cuff and one of the bones of the shoulder (acromion). Impingement syndrome is usually an overuse injury that causes swelling of the bursa (bursitis), swelling of the tendon (tendonitis), and/or a tear of the tendon (strain). Strains are classified into three categories. Grade 1 strains cause pain, but the tendon is not lengthened. Grade 2 strains include a lengthened ligament, due to the ligament being stretched or partially ruptured. With grade 2 strains there is still function, although the function may be decreased. Grade 3 strains include a complete tear of the tendon or muscle, and function is usually impaired. SYMPTOMS    Pain around the shoulder, often at the outer portion of the upper arm.   Pain that gets worse with shoulder function, especially when reaching overhead or lifting.   Sometimes, aching when not using the arm.   Pain that wakes you up at night.   Sometimes, tenderness, swelling, warmth, or redness over the affected area.   Loss of strength.   Limited motion of the shoulder, especially reaching behind the back (to the back pocket or to unhook bra) or across your body.   Crackling sound (crepitation) when moving the arm.     Biceps tendon pain and inflammation (in the front of the shoulder). Worse when bending the elbow or lifting.  CAUSES   Impingement syndrome is often an overuse injury, in which chronic (repetitive) motions cause the tendons or bursa to become inflamed. A strain occurs when a force is paced on the tendon or muscle that is greater than it can withstand. Common mechanisms of injury include: Stress from sudden increase in duration, frequency, or intensity of training.  Direct hit (trauma) to the shoulder.   Aging, erosion of the tendon with normal use.   Bony bump on shoulder (acromial spur).  RISK INCREASES WITH:  Contact sports (football, wrestling, boxing).   Throwing sports (baseball, tennis, volleyball).   Weightlifting and bodybuilding.   Heavy labor.   Previous injury to the rotator cuff, including impingement.   Poor shoulder strength and flexibility.   Failure to warm up properly before activity.   Inadequate protective equipment.   Old age.   Bony bump on shoulder (acromial spur).  PREVENTION    Warm up and stretch properly before activity.   Allow for adequate recovery between workouts.   Maintain physical fitness:   Strength, flexibility, and endurance.   Cardiovascular fitness.   Learn and use proper exercise technique.  PROGNOSIS   If treated properly, impingement syndrome usually goes away within 6 weeks. Sometimes surgery is required.   RELATED COMPLICATIONS    Longer healing time if not properly treated, or if not given enough time to heal.   Recurring symptoms, that result in a chronic  condition.   Shoulder stiffness, frozen shoulder, or loss of motion.   Rotator cuff tendon tear.   Recurring symptoms, especially if activity is resumed too soon, with overuse, with a direct blow, or when using poor technique.  TREATMENT   Treatment first involves the use of ice and medicine, to reduce pain and inflammation. The use of strengthening and  stretching exercises may help reduce pain with activity. These exercises may be performed at home or with a therapist. If non-surgical treatment is unsuccessful after more than 6 months, surgery may be advised. After surgery and rehabilitation, activity is usually possible in 3 months.   MEDICATION  If pain medicine is needed, nonsteroidal anti-inflammatory medicines (aspirin and ibuprofen), or other minor pain relievers (acetaminophen), are often advised.   Do not take pain medicine for 7 days before surgery.   Prescription pain relievers may be given, if your caregiver thinks they are needed. Use only as directed and only as much as you need.   Corticosteroid injections may be given by your caregiver. These injections should be reserved for the most serious cases, because they may only be given a certain number of times.  HEAT AND COLD  Cold treatment (icing) should be applied for 10 to 15 minutes every 2 to 3 hours for inflammation and pain, and immediately after activity that aggravates your symptoms. Use ice packs or an ice massage.   Heat treatment may be used before performing stretching and strengthening activities prescribed by your caregiver, physical therapist, or athletic trainer. Use a heat pack or a warm water soak.  SEEK MEDICAL CARE IF:    Symptoms get worse or do not improve in 4 to 6 weeks, despite treatment.   New, unexplained symptoms develop. (Drugs used in treatment may produce side effects.)  EXERCISES  do 15 repetitions of each exercise do 3 sets, hold for 2 counts  RANGE OF MOTION (ROM) AND STRETCHING EXERCISES - Impingement Syndrome (Rotator Cuff  Tendinitis, Bursitis) These exercises may help you when beginning to rehabilitate your injury. Your symptoms may go away with or without further involvement from your physician, physical therapist or athletic trainer. While completing these exercises, remember:    Restoring tissue flexibility helps normal motion to return  to the joints. This allows healthier, less painful movement and activity.   An effective stretch should be held for at least 30 seconds.   A stretch should never be painful. You should only feel a gentle lengthening or release in the stretched tissue.  STRETCH  Flexion, Standing  Stand with good posture. With an underhand grip on your right / left hand, and an overhand grip on the opposite hand, grasp a broomstick or cane so that your hands are a little more than shoulder width apart.   Keeping your right / left elbow straight and shoulder muscles relaxed, push the stick with your opposite hand, to raise your right / left arm in front of your body and then overhead. Raise your arm until you feel a stretch in your right / left shoulder, but before you have increased shoulder pain.   Try to avoid shrugging your right / left shoulder as your arm rises, by keeping your shoulder blade tucked down and toward your mid-back spine. Hold for __________ seconds.   Slowly return to the starting position.  Repeat __________ times. Complete this exercise __________ times per day. STRETCH  Abduction, Supine  Lie on your back. With an underhand grip on your right / left hand  and an overhand grip on the opposite hand, grasp a broomstick or cane so that your hands are a little more than shoulder width apart.   Keeping your right / left elbow straight and your shoulder muscles relaxed, push the stick with your opposite hand, to raise your right / left arm out to the side of your body and then overhead. Raise your arm until you feel a stretch in your right / left shoulder, but before you have increased shoulder pain.   Try to avoid shrugging your right / left shoulder as your arm rises, by keeping your shoulder blade tucked down and toward your mid-back spine. Hold for __________ seconds.   Slowly return to the starting position.  Repeat __________ times. Complete this exercise __________ times per day. ROM   Flexion, Active-Assisted  Lie on your back. You may bend your knees for comfort.   Grasp a broomstick or cane so your hands are about shoulder width apart. Your right / left hand should grip the end of the stick, so that your hand is positioned "thumbs-up," as if you were about to shake hands.   Using your healthy arm to lead, raise your right / left arm overhead, until you feel a gentle stretch in your shoulder. Hold for __________ seconds.   Use the stick to assist in returning your right / left arm to its starting position.  Repeat __________ times. Complete this exercise __________ times per day.   ROM - Internal Rotation, Supine   Lie on your back on a firm surface. Place your right / left elbow about 60 degrees away from your side. Elevate your elbow with a folded towel, so that the elbow and shoulder are the same height.   Using a broomstick or cane and your strong arm, pull your right / left hand toward your body until you feel a gentle stretch, but no increase in your shoulder pain. Keep your shoulder and elbow in place throughout the exercise.   Hold for __________ seconds. Slowly return to the starting position.  Repeat __________ times. Complete this exercise __________ times per day. STRETCH - Internal Rotation  Place your right / left hand behind your back, palm up.   Throw a towel or belt over your opposite shoulder. Grasp the towel with your right / left hand.   While keeping an upright posture, gently pull up on the towel, until you feel a stretch in the front of your right / left shoulder.   Avoid shrugging your right / left shoulder as your arm rises, by keeping your shoulder blade tucked down and toward your mid-back spine.   Hold for __________ seconds. Release the stretch, by lowering your healthy hand.  Repeat __________ times. Complete this exercise __________ times per day. ROM - Internal Rotation   Using an underhand grip, grasp a stick behind your back with  both hands.   While standing upright with good posture, slide the stick up your back until you feel a mild stretch in the front of your shoulder.   Hold for __________ seconds. Slowly return to your starting position.  Repeat __________ times. Complete this exercise __________ times per day.   STRETCH  Posterior Shoulder Capsule   Stand or sit with good posture. Grasp your right / left elbow and draw it across your chest, keeping it at the same height as your shoulder.   Pull your elbow, so your upper arm comes in closer to your chest. Pull until you feel  a gentle stretch in the back of your shoulder.   Hold for __________ seconds.  Repeat __________ times. Complete this exercise __________ times per day. STRENGTHENING EXERCISES - Impingement Syndrome (Rotator Cuff Tendinitis, Bursitis) These exercises may help you when beginning to rehabilitate your injury. They may resolve your symptoms with or without further involvement from your physician, physical therapist or athletic trainer. While completing these exercises, remember:  Muscles can gain both the endurance and the strength needed for everyday activities through controlled exercises.   Complete these exercises as instructed by your physician, physical therapist or athletic trainer. Increase the resistance and repetitions only as guided.   You may experience muscle soreness or fatigue, but the pain or discomfort you are trying to eliminate should never worsen during these exercises. If this pain does get worse, stop and make sure you are following the directions exactly. If the pain is still present after adjustments, discontinue the exercise until you can discuss the trouble with your clinician.   During your recovery, avoid activity or exercises which involve actions that place your injured hand or elbow above your head or behind your back or head. These positions stress the tissues which you are trying to heal.  STRENGTH - Scapular  Depression and Adduction   With good posture, sit on a firm chair. Support your arms in front of you, with pillows, arm rests, or on a table top. Have your elbows in line with the sides of your body.   Gently draw your shoulder blades down and toward your mid-back spine. Gradually increase the tension, without tensing the muscles along the top of your shoulders and the back of your neck.   Hold for __________ seconds. Slowly release the tension and relax your muscles completely before starting the next repetition.   After you have practiced this exercise, remove the arm support and complete the exercise in standing as well as sitting position.  Repeat __________ times. Complete this exercise __________ times per day.   STRENGTH - Shoulder Abductors, Isometric  With good posture, stand or sit about 4-6 inches from a wall, with your right / left side facing the wall.   Bend your right / left elbow. Gently press your right / left elbow into the wall. Increase the pressure gradually, until you are pressing as hard as you can, without shrugging your shoulder or increasing any shoulder discomfort.   Hold for __________ seconds.   Release the tension slowly. Relax your shoulder muscles completely before you begin the next repetition.  Repeat __________ times. Complete this exercise __________ times per day.   STRENGTH - External Rotators, Isometric  Keep your right / left elbow at your side and bend it 90 degrees.   Step into a door frame so that the outside of your right / left wrist can press against the door frame without your upper arm leaving your side.   Gently press your right / left wrist into the door frame, as if you were trying to swing the back of your hand away from your stomach. Gradually increase the tension, until you are pressing as hard as you can, without shrugging your shoulder or increasing any shoulder discomfort.   Hold for __________ seconds.   Release the tension  slowly. Relax your shoulder muscles completely before you begin the next repetition.  Repeat __________ times. Complete this exercise __________ times per day.   STRENGTH - Supraspinatus   Stand or sit with good posture. Grasp a __________ weight, or  an exercise band or tubing, so that your hand is "thumbs-up," like you are shaking hands.   Slowly lift your right / left arm in a "V" away from your thigh, diagonally into the space between your side and straight ahead. Lift your hand to shoulder height or as far as you can, without increasing any shoulder pain. At first, many people do not lift their hands above shoulder height.   Avoid shrugging your right / left shoulder as your arm rises, by keeping your shoulder blade tucked down and toward your mid-back spine.   Hold for __________ seconds. Control the descent of your hand, as you slowly return to your starting position.  Repeat __________ times. Complete this exercise __________ times per day.   STRENGTH - External Rotators  Secure a rubber exercise band or tubing to a fixed object (table, pole) so that it is at the same height as your right / left elbow when you are standing or sitting on a firm surface.   Stand or sit so that the secured exercise band is at your uninjured side.   Bend your right / left elbow 90 degrees. Place a folded towel or small pillow under your right / left arm, so that your elbow is a few inches away from your side.   Keeping the tension on the exercise band, pull it away from your body, as if pivoting on your elbow. Be sure to keep your body steady, so that the movement is coming only from your rotating shoulder.   Hold for __________ seconds. Release the tension in a controlled manner, as you return to the starting position.  Repeat __________ times. Complete this exercise __________ times per day.   STRENGTH - Internal Rotators   Secure a rubber exercise band or tubing to a fixed object (table, pole) so that  it is at the same height as your right / left elbow when you are standing or sitting on a firm surface.   Stand or sit so that the secured exercise band is at your right / left side.   Bend your elbow 90 degrees. Place a folded towel or small pillow under your right / left arm so that your elbow is a few inches away from your side.   Keeping the tension on the exercise band, pull it across your body, toward your stomach. Be sure to keep your body steady, so that the movement is coming only from your rotating shoulder.   Hold for __________ seconds. Release the tension in a controlled manner, as you return to the starting position.  Repeat __________ times. Complete this exercise __________ times per day.   STRENGTH - Scapular Protractors, Standing   Stand arms length away from a wall. Place your hands on the wall, keeping your elbows straight.   Begin by dropping your shoulder blades down and toward your mid-back spine.   To strengthen your protractors, keep your shoulder blades down, but slide them forward on your rib cage. It will feel as if you are lifting the back of your rib cage away from the wall. This is a subtle motion and can be challenging to complete. Ask your caregiver for further instruction, if you are not sure you are doing the exercise correctly.   Hold for __________ seconds. Slowly return to the starting position, resting the muscles completely before starting the next repetition.  Repeat __________ times. Complete this exercise __________ times per day. STRENGTH - Scapular Protractors, Supine  Lie on your  back on a firm surface. Extend your right / left arm straight into the air while holding a __________ weight in your hand.   Keeping your head and back in place, lift your shoulder off the floor.   Hold for __________ seconds. Slowly return to the starting position, and allow your muscles to relax completely before starting the next repetition.  Repeat __________  times. Complete this exercise __________ times per day. STRENGTH - Scapular Protractors, Quadruped  Get onto your hands and knees, with your shoulders directly over your hands (or as close as you can be, comfortably).   Keeping your elbows locked, lift the back of your rib cage up into your shoulder blades, so your mid-back rounds out. Keep your neck muscles relaxed.   Hold this position for __________ seconds. Slowly return to the starting position and allow your muscles to relax completely before starting the next repetition.  Repeat __________ times. Complete this exercise __________ times per day.   STRENGTH - Scapular Retractors  Secure a rubber exercise band or tubing to a fixed object (table, pole), so that it is at the height of your shoulders when you are either standing, or sitting on a firm armless chair.   With a palm down grip, grasp an end of the band in each hand. Straighten your elbows and lift your hands straight in front of you, at shoulder height. Step back, away from the secured end of the band, until it becomes tense.   Squeezing your shoulder blades together, draw your elbows back toward your sides, as you bend them. Keep your upper arms lifted away from your body throughout the exercise.   Hold for __________ seconds. Slowly ease the tension on the band, as you reverse the directions and return to the starting position.  Repeat __________ times. Complete this exercise __________ times per day. STRENGTH - Shoulder Extensors   Secure a rubber exercise band or tubing to a fixed object (table, pole) so that it is at the height of your shoulders when you are either standing, or sitting on a firm armless chair.   With a thumbs-up grip, grasp an end of the band in each hand. Straighten your elbows and lift your hands straight in front of you, at shoulder height. Step back, away from the secured end of the band, until it becomes tense.   Squeezing your shoulder blades  together, pull your hands down to the sides of your thighs. Do not allow your hands to go behind you.   Hold for __________ seconds. Slowly ease the tension on the band, as you reverse the directions and return to the starting position.  Repeat __________ times. Complete this exercise __________ times per day.   STRENGTH - Scapular Retractors and External Rotators   Secure a rubber exercise band or tubing to a fixed object (table, pole) so that it is at the height as your shoulders, when you are either standing, or sitting on a firm armless chair.   With a palm down grip, grasp an end of the band in each hand. Bend your elbows 90 degrees and lift your elbows to shoulder height, at your sides. Step back, away from the secured end of the band, until it becomes tense.   Squeezing your shoulder blades together, rotate your shoulders so that your upper arms and elbows remain stationary, but your fists travel upward to head height.   Hold for __________ seconds. Slowly ease the tension on the band, as you reverse the  directions and return to the starting position.  Repeat __________ times. Complete this exercise __________ times per day.   STRENGTH - Scapular Retractors and External Rotators, Rowing   Secure a rubber exercise band or tubing to a fixed object (table, pole) so that it is at the height of your shoulders, when you are either standing, or sitting on a firm armless chair.   With a palm down grip, grasp an end of the band in each hand. Straighten your elbows and lift your hands straight in front of you, at shoulder height. Step back, away from the secured end of the band, until it becomes tense.   Step 1: Squeeze your shoulder blades together. Bending your elbows, draw your hands to your chest, as if you are rowing a boat. At the end of this motion, your hands and elbow should be at shoulder height and your elbows should be out to your sides.   Step 2: Rotate your shoulders, to raise your  hands above your head. Your forearms should be vertical and your upper arms should be horizontal.   Hold for __________ seconds. Slowly ease the tension on the band, as you reverse the directions and return to the starting position.  Repeat __________ times. Complete this exercise __________ times per day.   STRENGTH  Scapular Depressors  Find a sturdy chair without wheels, such as a dining room chair.   Keeping your feet on the floor, and your hands on the chair arms, lift your bottom up from the seat, and lock your elbows.   Keeping your elbows straight, allow gravity to pull your body weight down. Your shoulders will rise toward your ears.   Raise your body against gravity by drawing your shoulder blades down your back, shortening the distance between your shoulders and ears. Although your feet should always maintain contact with the floor, your feet should progressively support less body weight, as you get stronger.   Hold for __________ seconds. In a controlled and slow manner, lower your body weight to begin the next repetition.  Repeat __________ times. Complete this exercise __________ times per day.   Document Released: 05/14/2005 Document Revised: 08/06/2011 Document Reviewed: 08/26/2008 [facility] Patient Information 2013 [place], [place].

## 2012-05-29 ENCOUNTER — Telehealth: Payer: Self-pay | Admitting: *Deleted

## 2012-05-29 NOTE — Telephone Encounter (Signed)
Faxed referral and notes to [facility]. Awaiting appointment.

## 2012-06-16 ENCOUNTER — Other Ambulatory Visit: Payer: Self-pay | Admitting: *Deleted

## 2012-06-16 DIAGNOSIS — M4800 Spinal stenosis, site unspecified: Secondary | ICD-10-CM

## 2012-07-07 NOTE — Progress Notes (Signed)
Patient is declining an appointment with [facility] at this time.

## 2013-04-03 ENCOUNTER — Encounter: Payer: Self-pay | Admitting: Internal Medicine

## 2013-04-03 ENCOUNTER — Ambulatory Visit (INDEPENDENT_AMBULATORY_CARE_PROVIDER_SITE_OTHER): Payer: Managed Care, Other (non HMO) | Admitting: Internal Medicine

## 2013-04-03 VITALS — BP 130/80 | HR 57 | Ht 72.0 in | Wt 226.7 lb

## 2013-04-03 DIAGNOSIS — F17200 Nicotine dependence, unspecified, uncomplicated: Secondary | ICD-10-CM | POA: Insufficient documentation

## 2013-04-03 DIAGNOSIS — I2510 Atherosclerotic heart disease of native coronary artery without angina pectoris: Secondary | ICD-10-CM | POA: Insufficient documentation

## 2013-04-03 DIAGNOSIS — E785 Hyperlipidemia, unspecified: Secondary | ICD-10-CM

## 2013-04-03 DIAGNOSIS — N529 Male erectile dysfunction, unspecified: Secondary | ICD-10-CM

## 2013-04-03 DIAGNOSIS — I10 Essential (primary) hypertension: Secondary | ICD-10-CM

## 2013-04-03 MED ORDER — BUPROPION HCL ER (SMOKING DET) 150 MG PO TB12: ORAL_TABLET | ORAL | Status: DC

## 2013-04-03 MED ORDER — SILDENAFIL CITRATE 50 MG PO TABS: 50.0000 mg | ORAL_TABLET | Freq: Every day | ORAL | Status: DC | PRN

## 2013-04-03 NOTE — Progress Notes (Signed)
OFFICE NOTE  Chief Complaint:  No complaints, wants to quit smoking  Primary Care Physician: Colette Ribas, MD  HPI:  [name] is a 57 year old male who presented to me for evaluation of erectile dysfunction and pain in his legs and tingling in his feet. He has a history of coronary disease and had an acute coronary syndrome in 2010 and had a stent in the circumflex territory. Unfortunately he continues to smoke which is a major risk factor. I recommended Dopplers to assess for peripheral arterial disease. He had abdominal aortic Dopplers performed which demonstrated mild distal abdominal aortic dilatation measuring 3.0 x 3.2 cm. Carotid Dopplers showed minimal bilateral plaque at 0-49% but closer to 10%, and lower extremity arterial Dopplers performed showing normal ABIs of 1.2 bilaterally with normal velocities and no evidence of obstruction. I shared the results with the patient today. He continues to have pain down his legs. I suspect this may be sciatica of low back origin. He has had no MRI of the back before but did have hip x-rays for left hip pain which showed acetabular spurring with no evidence of a problem with the hip joint. Again he continues to have complaints of erectile dysfunction as well as back pain and pain that shoots down his legs which may be neurologic, and the two could be related. As he has no current primary care physician I plan to refer him to Dr. Romeo Apple for evaluation of his low back pain and sciatica and Dr. Rito Ehrlich for evaluation of his erectile dysfunction. I understand that he never saw the urologist, but he did see Dr. Romeo Apple who picked up on upper extremity pain as well and recommended a neurosurgery evaluation - [name] cancelled that appointment, saying he cannot affort it. Finally, he has asked to quit smoking again.  I had prescribed Chantix in the past, but he never got it filled.  I suspect he has some concomitant depression.   PMHx:    Past Medical History  Diagnosis Date  . Hypertension   . Obesity   . Hyperlipidemia   . CAD (coronary artery disease)     cardiac cath 2010      . Diabetes mellitus   . Dilatation of aorta     Abdominal aortic dopplers on 04/17/2012 showed mild distal abdominal aortic dilatation measuring 3.0x3.2cm    Past Surgical History  Procedure Laterality Date  . Cardiac catheterization      11/16/2008  2.5 x 15 XIENCE V drug-eluting stent  . Knee surgery      Right    FAMHx:  Family History  Problem Relation Age of Onset  . Diabetes Mother   . Diabetes Paternal Grandmother   . Diabetes Brother     SOCHx:   reports that he has been smoking Cigarettes.  He has a 60 pack-year smoking history. He has never used smokeless tobacco. He reports that he drinks about 0.5 ounces of alcohol per week. He reports that he does not use illicit drugs.  ALLERGIES:  Allergies  Allergen Reactions  . Shellfish-Derived Products     ROS: A comprehensive review of systems was negative except for: Genitourinary: positive for erectile dysfunction Behavioral/Psych: positive for depression  HOME MEDS: Current Outpatient Prescriptions  Medication Sig Dispense Refill  . aspirin 325 MG EC tablet Take 325 mg by mouth daily.        . Cholecalciferol (VITAMIN D PO) Take by mouth daily.      . Multiple  Vitamin (MULTIVITAMIN) capsule Take 1 capsule by mouth daily.      . Omega-3 Fatty Acids (FISH OIL PO) Take 1 capsule by mouth daily.      . pravastatin (PRAVACHOL) 40 MG tablet Take 40 mg by mouth daily.      . ranitidine (ZANTAC) 150 MG tablet Take 150 mg by mouth 2 (two) times daily as needed.       . tamsulosin (FLOMAX) 0.4 MG CAPS capsule Take 0.4 mg by mouth daily.      Marland Kitchen buPROPion (ZYBAN) 150 MG 12 hr tablet Take 1 tablet for 3 days, then 1 tablet twice daily (every 12 hours)  33 tablet  2  . sildenafil (VIAGRA) 50 MG tablet Take 1 tablet (50 mg total) by mouth daily as needed for erectile dysfunction.   20 tablet  0   No current facility-administered medications for this visit.    LABS/IMAGING: No results found for this or any previous visit (from the past 48 hour(s)). No results found.  VITALS: BP 130/80  Pulse 57  Ht 6' (1.829 m)  Wt 226 lb 11.2 oz (102.83 kg)  BMI 30.74 kg/m2  EXAM: General appearance: alert and no distress Neck: no carotid bruit and no JVD Lungs: rhonchi bilaterally Heart: regular rate and rhythm, S1, S2 normal, no murmur, click, rub or gallop Abdomen: soft, non-tender; bowel sounds normal; no masses,  no organomegaly Extremities: extremities normal, atraumatic, no cyanosis or edema Pulses: 2+ and symmetric Skin: Skin color, texture, turgor normal. No rashes or lesions Neurologic: Grossly normal Psych: Appears somewhat depressed  EKG: Sinus bradycardia at 57  ASSESSMENT: 1. Tobacco dependence 2. Mild PAD 3. Erectile dysfunction 4. HTN 5. Dyslipidemia 6. DM2  PLAN: 1.   Mr. Morozov has no complaints today other than ongoing erectile dysfunction and some associated depression. He again wants to try to quit smoking - said he could not afford Chantix.  Will Rx for wellbutrin.  Gave samples and Rx for Viagra.  Counseled for over 5 minutes about smoking cessation and the use of wellbutrin.  Plan follow-up in 3 months. Will defer further Rx's for viagra to PCP.  Strictly prohibited use of viagra with nitrates.  Chrystie Nose, MD, [facility] Attending Cardiologist [facility]  Ellina Sivertsen C 04/03/2013, 4:51 PM

## 2013-04-03 NOTE — Patient Instructions (Addendum)
Take 1 tablet of wellbutrin for 3 days then take 2 tablets daily (1 tablet every 12 hours).  Your physician wants you to follow-up in: 3 months.

## 2013-04-06 ENCOUNTER — Encounter: Payer: Self-pay | Admitting: Internal Medicine

## 2013-04-06 ENCOUNTER — Other Ambulatory Visit: Payer: Self-pay | Admitting: Internal Medicine

## 2013-04-06 MED ORDER — BUPROPION HCL ER (SMOKING DET) 150 MG PO TB12: ORAL_TABLET | ORAL | Status: DC

## 2014-03-17 ENCOUNTER — Encounter (HOSPITAL_COMMUNITY): Payer: Self-pay | Admitting: Emergency Medicine

## 2014-03-17 ENCOUNTER — Emergency Department (HOSPITAL_COMMUNITY)
Admission: EM | Admit: 2014-03-17 | Discharge: 2014-03-17 | Disposition: A | Discharge status: Home / Self Care | Payer: Managed Care, Other (non HMO) | Attending: Emergency Medicine | Admitting: Emergency Medicine

## 2014-03-17 DIAGNOSIS — L03115 Cellulitis of right lower limb: Secondary | ICD-10-CM | POA: Diagnosis not present

## 2014-03-17 DIAGNOSIS — Z7982 Long term (current) use of aspirin: Secondary | ICD-10-CM | POA: Diagnosis not present

## 2014-03-17 DIAGNOSIS — Z792 Long term (current) use of antibiotics: Secondary | ICD-10-CM | POA: Insufficient documentation

## 2014-03-17 DIAGNOSIS — Z79899 Other long term (current) drug therapy: Secondary | ICD-10-CM | POA: Diagnosis not present

## 2014-03-17 DIAGNOSIS — L02415 Cutaneous abscess of right lower limb: Secondary | ICD-10-CM | POA: Insufficient documentation

## 2014-03-17 DIAGNOSIS — Z9889 Other specified postprocedural states: Secondary | ICD-10-CM | POA: Diagnosis not present

## 2014-03-17 DIAGNOSIS — I2510 Atherosclerotic heart disease of native coronary artery without angina pectoris: Secondary | ICD-10-CM | POA: Insufficient documentation

## 2014-03-17 DIAGNOSIS — I10 Essential (primary) hypertension: Secondary | ICD-10-CM | POA: Insufficient documentation

## 2014-03-17 DIAGNOSIS — E669 Obesity, unspecified: Secondary | ICD-10-CM | POA: Diagnosis not present

## 2014-03-17 DIAGNOSIS — Z720 Tobacco use: Secondary | ICD-10-CM | POA: Diagnosis not present

## 2014-03-17 DIAGNOSIS — L0291 Cutaneous abscess, unspecified: Secondary | ICD-10-CM

## 2014-03-17 DIAGNOSIS — M25561 Pain in right knee: Secondary | ICD-10-CM | POA: Diagnosis present

## 2014-03-17 DIAGNOSIS — L0390 Cellulitis, unspecified: Secondary | ICD-10-CM

## 2014-03-17 LAB — CBC WITH DIFFERENTIAL/PLATELET
BASOS PCT: 1 % (ref 0–1)
Basophils Absolute: 0.1 10*3/uL (ref 0.0–0.1)
EOS ABS: 0.2 10*3/uL (ref 0.0–0.7)
Eosinophils Relative: 2 % (ref 0–5)
HCT: 45.7 % (ref 39.0–52.0)
HEMOGLOBIN: 16.2 g/dL (ref 13.0–17.0)
Lymphocytes Relative: 24 % (ref 12–46)
Lymphs Abs: 2.8 10*3/uL (ref 0.7–4.0)
MCH: 30.5 pg (ref 26.0–34.0)
MCHC: 35.4 g/dL (ref 30.0–36.0)
MCV: 85.9 fL (ref 78.0–100.0)
MONOS PCT: 7 % (ref 3–12)
Monocytes Absolute: 0.7 10*3/uL (ref 0.1–1.0)
NEUTROS ABS: 7.6 10*3/uL (ref 1.7–7.7)
NEUTROS PCT: 66 % (ref 43–77)
Platelets: 321 10*3/uL (ref 150–400)
RBC: 5.32 MIL/uL (ref 4.22–5.81)
RDW: 13.9 % (ref 11.5–15.5)
WBC: 11.4 10*3/uL — ABNORMAL HIGH (ref 4.0–10.5)

## 2014-03-17 LAB — BASIC METABOLIC PANEL
ANION GAP: 14 (ref 5–15)
BUN: 12 mg/dL (ref 6–23)
CHLORIDE: 96 meq/L (ref 96–112)
CO2: 23 mEq/L (ref 19–32)
Calcium: 9.8 mg/dL (ref 8.4–10.5)
Creatinine, Ser: 0.95 mg/dL (ref 0.50–1.35)
GFR calc Af Amer: >90 mL/min (ref >90)
GFR calc non Af Amer: >90 mL/min (ref >90)
Glucose, Bld: 241 mg/dL — ABNORMAL HIGH (ref 70–99)
Potassium: 4.3 mEq/L (ref 3.7–5.3)
Sodium: 133 mEq/L — ABNORMAL LOW (ref 137–147)

## 2014-03-17 MED ORDER — VANCOMYCIN HCL IN DEXTROSE 1-5 GM/200ML-% IV SOLN
1000.0000 mg | Freq: Once | INTRAVENOUS | Status: AC
  Administered 2014-03-17: 1000 mg via INTRAVENOUS
  Filled 2014-03-17: qty 200

## 2014-03-17 MED ORDER — HYDROCODONE-ACETAMINOPHEN 5-325 MG PO TABS: ORAL_TABLET | ORAL | Status: DC

## 2014-03-17 NOTE — ED Provider Notes (Signed)
CSN: [id number]     Arrival date & time 03/17/14  0740 History   First MD Initiated Contact with Patient 03/17/14 [phone]     Chief Complaint  Patient presents with  . Knee Pain     (Consider location/radiation/quality/duration/timing/severity/associated sxs/prior Treatment) HPI  [name] is a 58 y.o. male who presents to the Emergency Department complaining of right knee pain and swelling.  He states that he noticed a small "pimple" to his knee last week which he scraped off and then began to notice swelling and redness to the area. He states that he was seen at an urgent care on Monday and started on Bactrim twice daily.  He was advised to f/u with his PMD which he states he saw and was advised to come to the ED for further evaluation.  Patient reports having pain to his knee after work which involves bending his knees frequently.  He denies fever, chills, body aches, nausea or vomiting.     Past Medical History  Diagnosis Date  . Hypertension   . Obesity   . Hyperlipidemia   . CAD (coronary artery disease)     cardiac cath 2010      . Diabetes mellitus   . Dilatation of aorta     Abdominal aortic dopplers on 04/17/2012 showed mild distal abdominal aortic dilatation measuring 3.0x3.2cm   Past Surgical History  Procedure Laterality Date  . Cardiac catheterization      11/16/2008  2.5 x 15 XIENCE V drug-eluting stent  . Knee surgery      Right   Family History  Problem Relation Age of Onset  . Diabetes Mother   . Diabetes Paternal Grandmother   . Diabetes Brother    History  Substance Use Topics  . Smoking status: Current Every Day Smoker -- 1.50 packs/day for 40 years    Types: Cigarettes  . Smokeless tobacco: Never Used  . Alcohol Use: 0.5 oz/week    1 drink(s) per week     Comment: one half of a fifth of crown on saturday nights    Review of Systems  Constitutional: Negative for fever and chills.  Gastrointestinal: Negative for nausea and vomiting.   Genitourinary: Negative for dysuria and difficulty urinating.  Musculoskeletal: Positive for arthralgias and joint swelling. Negative for neck pain and neck stiffness.       Right knee tenderness and swelling  Skin: Positive for color change. Negative for wound.       Abscess   Neurological: Negative for weakness and numbness.  Hematological: Negative for adenopathy.  All other systems reviewed and are negative.     Allergies  Shellfish-derived products  Home Medications   Prior to Admission medications   Medication Sig Start Date End Date Taking? Authorizing Provider  aspirin 325 MG EC tablet Take 325 mg by mouth daily.     Yes Historical Provider, MD  metFORMIN (GLUCOPHAGE) 1000 MG tablet Take 1,000 mg by mouth 2 (two) times daily. 03/15/14 03/16/15 Yes Historical Provider, MD  sulfamethoxazole-trimethoprim (SEPTRA DS) 800-160 MG per tablet Take 1 tablet by mouth 2 (two) times daily. Starting 03/15/2014 and ending 03/22/2014. 03/15/14 03/22/14 Yes Historical Provider, MD   BP 161/80  Pulse 64  Temp(Src) 97.7 F (36.5 C) (Oral)  Resp 16  Ht 6' (1.829 m)  Wt 235 lb (106.595 kg)  BMI 31.86 kg/m2  SpO2 99% Physical Exam  Nursing note and vitals reviewed. Constitutional: He is oriented to person, place, and time. He appears well-developed  and well-nourished. No distress.  HENT:  Head: Normocephalic and atraumatic.  Mouth/Throat: Oropharynx is clear and moist.  Neck: Normal range of motion. Neck supple.  Cardiovascular: Normal rate, regular rhythm, normal heart sounds and intact distal pulses.   No murmur heard. Pulmonary/Chest: Effort normal and breath sounds normal. No respiratory distress. He exhibits no tenderness.  Musculoskeletal: Normal range of motion.  Localized area of erythema and induration to the anterior right patella.  Slight erythema and STS of the anterior right LE.  No fluctuance.  compartments are soft, distal sensation intact.  Pt has full ROM of the knee.     Lymphadenopathy:    He has no cervical adenopathy.  Neurological: He is alert and oriented to person, place, and time. He exhibits normal muscle tone. Coordination normal.  Skin: Skin is warm and dry.    ED Course  Procedures (including critical care time) Labs Review Labs Reviewed  CBC WITH DIFFERENTIAL - Abnormal; Notable for the following:    WBC 11.4 (*)    All other components within normal limits  BASIC METABOLIC PANEL - Abnormal; Notable for the following:    Sodium 133 (*)    Glucose, Bld 241 (*)    All other components within normal limits    Imaging Review No results found.   EKG Interpretation None      MDM   Final diagnoses:  Cellulitis and abscess    IV clindamycin given, labs are unremarkable except for blood sugar is mildly elevated at 241 anion gap is within normal limits.  Patient is nontoxic appearing. Localized early abscess to the right knee with mild cellulitis present to the right lower extremity. Neurovascularly intact. Patient has full range of motion of the knee joint. Infection appears to be superficial. He is currently taking Bactrim.  Patient was also evaluated by Dr. Roderic Palau and care plan was discussed.  Patient agrees to elevate his leg, continue Bactrim, and to return here tomorrow for recheck.    Yaa Donnellan L. Vanessa [place], PA-C 03/18/14 [phone]

## 2014-03-17 NOTE — Discharge Instructions (Signed)
Abscess An abscess (boil or furuncle) is an infected area on or under the skin. This area is filled with yellowish-white fluid (pus) and other material (debris). HOME CARE   Only take medicines as told by your doctor.  If you were given antibiotic medicine, take it as directed. Finish the medicine even if you start to feel better.  If gauze is used, follow your doctor's directions for changing the gauze.  To avoid spreading the infection:  Keep your abscess covered with a bandage.  Wash your hands well.  Do not share personal care items, towels, or whirlpools with others.  Avoid skin contact with others.  Keep your skin and clothes clean around the abscess.  Keep all doctor visits as told. GET HELP RIGHT AWAY IF:   You have more pain, puffiness (swelling), or redness in the wound site.  You have more fluid or blood coming from the wound site.  You have muscle aches, chills, or you feel sick.  You have a fever. MAKE SURE YOU:   Understand these instructions.  Will watch your condition.  Will get help right away if you are not doing well or get worse. Document Released: 10/31/2007 Document Revised: 11/13/2011 Document Reviewed: 07/27/2011 [facility] Patient Information 2015 [place], [place]. This information is not intended to replace advice given to you by your health care provider. Make sure you discuss any questions you have with your health care provider.  Cellulitis Cellulitis is an infection of the skin and the tissue under the skin. The infected area is usually red and tender. This happens most often in the arms and lower legs. HOME CARE   Take your antibiotic medicine as told. Finish the medicine even if you start to feel better.  Keep the infected arm or leg raised (elevated).  Put a warm cloth on the area up to 4 times per day.  Only take medicines as told by your doctor.  Keep all doctor visits as told. GET HELP IF:  You see red streaks on the skin  coming from the infected area.  Your red area gets bigger or turns a dark color.  Your bone or joint under the infected area is painful after the skin heals.  Your infection comes back in the same area or different area.  You have a puffy (swollen) bump in the infected area.  You have new symptoms.  You have a fever. GET HELP RIGHT AWAY IF:   You feel very sleepy.  You throw up (vomit) or have watery poop (diarrhea).  You feel sick and have muscle aches and pains. MAKE SURE YOU:   Understand these instructions.  Will watch your condition.  Will get help right away if you are not doing well or get worse. Document Released: 10/31/2007 Document Revised: 09/28/2013 Document Reviewed: 07/30/2011 [facility] Patient Information 2015 [place], [place]. This information is not intended to replace advice given to you by your health care provider. Make sure you discuss any questions you have with your health care provider.

## 2014-03-17 NOTE — ED Notes (Addendum)
Pt had knee pain that started a week ago. He was previously seen at a "walk in" facility yesterday that gave him an antibiotic. He comes in today because the pain has increased and he "doesn't see that the antibiotic is helping any".   Pt has past history of knee surgery on that knee.

## 2014-03-18 NOTE — ED Provider Notes (Signed)
Medical screening examination/treatment/procedure(s) were performed by non-physician practitioner and as supervising physician I was immediately available for consultation/collaboration.   EKG Interpretation None        Maudry Diego, MD 03/18/14 1531

## 2016-09-29 ENCOUNTER — Emergency Department (HOSPITAL_COMMUNITY)
Admission: EM | Admit: 2016-09-29 | Discharge: 2016-09-29 | Disposition: A | Discharge status: Home / Self Care | Payer: Self-pay | Attending: Emergency Medicine | Admitting: Emergency Medicine

## 2016-09-29 ENCOUNTER — Emergency Department (HOSPITAL_COMMUNITY): Payer: Self-pay

## 2016-09-29 ENCOUNTER — Encounter (HOSPITAL_COMMUNITY): Payer: Self-pay | Admitting: Emergency Medicine

## 2016-09-29 DIAGNOSIS — S39012A Strain of muscle, fascia and tendon of lower back, initial encounter: Secondary | ICD-10-CM | POA: Insufficient documentation

## 2016-09-29 DIAGNOSIS — E119 Type 2 diabetes mellitus without complications: Secondary | ICD-10-CM | POA: Insufficient documentation

## 2016-09-29 DIAGNOSIS — S161XXA Strain of muscle, fascia and tendon at neck level, initial encounter: Secondary | ICD-10-CM | POA: Insufficient documentation

## 2016-09-29 DIAGNOSIS — Y929 Unspecified place or not applicable: Secondary | ICD-10-CM | POA: Insufficient documentation

## 2016-09-29 DIAGNOSIS — W19XXXA Unspecified fall, initial encounter: Secondary | ICD-10-CM | POA: Insufficient documentation

## 2016-09-29 DIAGNOSIS — Y999 Unspecified external cause status: Secondary | ICD-10-CM | POA: Insufficient documentation

## 2016-09-29 DIAGNOSIS — I10 Essential (primary) hypertension: Secondary | ICD-10-CM | POA: Insufficient documentation

## 2016-09-29 DIAGNOSIS — Z7984 Long term (current) use of oral hypoglycemic drugs: Secondary | ICD-10-CM | POA: Insufficient documentation

## 2016-09-29 DIAGNOSIS — F17210 Nicotine dependence, cigarettes, uncomplicated: Secondary | ICD-10-CM | POA: Insufficient documentation

## 2016-09-29 DIAGNOSIS — I2510 Atherosclerotic heart disease of native coronary artery without angina pectoris: Secondary | ICD-10-CM | POA: Insufficient documentation

## 2016-09-29 DIAGNOSIS — Z7982 Long term (current) use of aspirin: Secondary | ICD-10-CM | POA: Insufficient documentation

## 2016-09-29 DIAGNOSIS — Y939 Activity, unspecified: Secondary | ICD-10-CM | POA: Insufficient documentation

## 2016-09-29 MED ORDER — METHOCARBAMOL 500 MG PO TABS: 500.0000 mg | ORAL_TABLET | Freq: Three times a day (TID) | ORAL | 0 refills | Status: DC

## 2016-09-29 MED ORDER — HYDROCODONE-ACETAMINOPHEN 5-325 MG PO TABS: ORAL_TABLET | ORAL | 0 refills | Status: DC

## 2016-09-29 MED ORDER — HYDROCODONE-ACETAMINOPHEN 5-325 MG PO TABS
1.0000 | ORAL_TABLET | Freq: Once | ORAL | Status: AC
  Administered 2016-09-29: 1 via ORAL
  Filled 2016-09-29: qty 1

## 2016-09-29 MED ORDER — NAPROXEN 500 MG PO TABS: 500.0000 mg | ORAL_TABLET | Freq: Two times a day (BID) | ORAL | 0 refills | Status: DC

## 2016-09-29 NOTE — ED Notes (Signed)
Pt. To x-ray .

## 2016-09-29 NOTE — ED Provider Notes (Signed)
[facility] DEPT Provider Note   CSN: [id number] Arrival date & time: 09/29/16  1009     History   Chief Complaint Chief Complaint  Patient presents with  . Back Pain    HPI [name] is a 61 y.o. male.  HPI   [name] is a 61 y.o. male who presents to the Emergency Department complaining of Neck, low back, and hip pain since a fall last evening. He states that he sat in a chair and one of the legs the chair broke causing him to fall backwards onto his back. He states that he felt "okay" after the injury, but states this morning he woke up with pain to his neck with movement and pain of his lower back that radiates into his right leg to the level of his knee. Pain is worse with movement and improves with rest. He also states that he struck the back of his head but denies any loss of consciousness. He also denies vomiting, visual changes, dizziness, numbness or weakness of the extremities, abdominal pain, urine or bowel changes or swelling of the joints. He took Tylenol last evening without relief.   Past Medical History:  Diagnosis Date  . CAD (coronary artery disease)    cardiac cath 2010      . Diabetes mellitus   . Dilatation of aorta (HCC)    Abdominal aortic dopplers on 04/17/2012 showed mild distal abdominal aortic dilatation measuring 3.0x3.2cm  . Hyperlipidemia   . Hypertension   . Obesity     Patient Active Problem List   Diagnosis Date Noted  . Tobacco dependence 04/03/2013  . Erectile dysfunction 04/03/2013  . CAD (coronary artery disease) 04/03/2013  . Degenerative disc disease, lumbar 05/27/2012  . Spinal stenosis 05/27/2012  . Impingement syndrome, shoulder 05/27/2012  . LLQ pain 08/28/2010  . HYPERLIPIDEMIA-MIXED 01/17/2009  . OVERWEIGHT/OBESITY 01/17/2009  . HYPERTENSION, UNSPECIFIED 01/17/2009  . CAD, NATIVE VESSEL 01/17/2009    Past Surgical History:  Procedure Laterality Date  . CARDIAC CATHETERIZATION     11/16/2008  2.5 x 15  XIENCE V drug-eluting stent  . KNEE SURGERY     Right       Home Medications    Prior to Admission medications   Medication Sig Start Date End Date Taking? Authorizing Provider  aspirin 325 MG EC tablet Take 325 mg by mouth daily.      [provider]  HYDROcodone-acetaminophen (NORCO/VICODIN) 5-325 MG per tablet Take one-two tabs po q 4-6 hrs prn pain 03/17/14   Shahzaib Azevedo, PA-C    Family History Family History  Problem Relation Age of Onset  . Diabetes Mother   . Diabetes Paternal Grandmother   . Diabetes Brother     Social History Social History  Substance Use Topics  . Smoking status: Current Every Day Smoker    Packs/day: 1.50    Years: 40.00    Types: Cigarettes  . Smokeless tobacco: Never Used  . Alcohol use 0.5 oz/week    1 Standard drinks or equivalent per week     Comment: one half of a fifth of crown on saturday nights     Allergies   Shellfish-derived products   Review of Systems Review of Systems  Constitutional: Negative for chills and fever.  Respiratory: Negative for shortness of breath.   Cardiovascular: Negative for chest pain.  Gastrointestinal: Negative for abdominal pain, nausea and vomiting.  Genitourinary: Negative for difficulty urinating and dysuria.  Musculoskeletal: Positive for back pain and  neck pain. Negative for arthralgias and joint swelling.  Skin: Negative for color change and wound.  Neurological: Positive for headaches. Negative for dizziness, syncope, weakness and numbness.  All other systems reviewed and are negative.    Physical Exam Updated Vital Signs BP 132/64 (BP Location: Left Arm)   Pulse 72   Temp 97.8 F (36.6 C) (Oral)   Resp 18   Ht 6' (1.829 m)   Wt 99.8 kg   SpO2 97%   BMI 29.84 kg/m   Physical Exam  Constitutional: He is oriented to person, place, and time. He appears well-developed and well-nourished. No distress.  HENT:  Head: Normocephalic and atraumatic.  Mouth/Throat:  Oropharynx is clear and moist.  No scalp hematoma  Eyes: EOM are normal. Pupils are equal, round, and reactive to light.  Neck: Normal range of motion. Neck supple.  Cardiovascular: Normal rate, regular rhythm and intact distal pulses.   No murmur heard. Pulmonary/Chest: Effort normal and breath sounds normal. No respiratory distress.  Abdominal: Soft. He exhibits no distension. There is no tenderness.  Musculoskeletal: He exhibits tenderness. He exhibits no edema.       Lumbar back: He exhibits tenderness and pain. He exhibits normal range of motion, no swelling, no deformity, no laceration and normal pulse.  ttp of the lower midline lumbar spine and right paraspinal muscles, tenderness of the lower c spine. No bony step-offs.   Pt has 5/5 strength against resistance of bilateral lower extremities.     Neurological: He is alert and oriented to person, place, and time. He has normal strength. No sensory deficit. He exhibits normal muscle tone. Coordination and gait normal.  Reflex Scores:      Patellar reflexes are 2+ on the right side and 2+ on the left side.      Achilles reflexes are 2+ on the right side and 2+ on the left side. CN II - XII grossly intact  Skin: Skin is warm and dry. Capillary refill takes less than 2 seconds. No rash noted.  Psychiatric: He has a normal mood and affect.  Nursing note and vitals reviewed.    ED Treatments / Results  Labs (all labs ordered are listed, but only abnormal results are displayed) Labs Reviewed - No data to display  EKG  EKG Interpretation None       Radiology Dg Cervical Spine Complete  Result Date: 09/29/2016 CLINICAL DATA:  Initial encounter for FALL, Pt reports sitting in a chair this morning and the leg broke and he fell back. C/o lower back pain going down both legs. PATIENT STATES " CURRENTLY HIS LEFT HIP IS THE MOST PAINFUL" HISTORY OF DM, HTN, CAD EXAM: CERVICAL SPINE - COMPLETE 4+ VIEW COMPARISON:  04/17/2005 MRI FINDINGS:  The lateral view images through the bottom of C7. Prevertebral soft tissues are within normal limits. Multilevel spondylosis, most significant at C4-5 and C5-6. Facets are well-aligned. Bilateral neural foraminal narrowing at multiple mid cervical level secondary to uncovertebral joint hypertrophy. Lateral masses and odontoid partially obscured on open-mouth view. No displaced fracture identified. IMPRESSION: Mildly degraded evaluation of C1-2 and C7-T1. Otherwise, no acute osseous finding. Moderate mid cervical spondylosis. Electronically Signed   By: Abigail Miyamoto M.D.   On: 09/29/2016 12:10   Dg Lumbar Spine Complete  Result Date: 09/29/2016 CLINICAL DATA:  Fall, acute low back pain EXAM: LUMBAR SPINE - COMPLETE 4+ VIEW COMPARISON:  05/27/2012 FINDINGS: Normal alignment. Minor endplate degenerative bony spurring. No acute compression fracture, wedge-shaped deformity or focal  kyphosis. Facets are aligned. Lower lumbar facet arthropathy noted at L4-5 and L5-S1. Negative for pars defects. Normal appearing pedicles and SI joints. Calcifications in the right abdomen compatible with cholelithiasis. On the lateral view, there is atherosclerosis and aneurysmal dilatation of the abdominal aorta measuring 4.3 cm in AP dimension. IMPRESSION: Diffuse lumbar degenerative spondylosis without acute osseous finding or compression fracture. Cholelithiasis 4.3 cm infrarenal abdominal aortic aneurysm. Electronically Signed   By: Jerilynn Mages.  Shick M.D.   On: 09/29/2016 12:12   Dg Hips Bilat W Or Wo Pelvis 2 Views  Result Date: 09/29/2016 CLINICAL DATA:  Left hip pain after fall today. EXAM: DG HIP (WITH OR WITHOUT PELVIS) 2V BILAT COMPARISON:  None. FINDINGS: There is no evidence of hip fracture or dislocation. There is no evidence of arthropathy or other focal bone abnormality. IMPRESSION: Normal bilateral hips. Electronically Signed   By: Marijo Conception, M.D.   On: 09/29/2016 12:12     Procedures Procedures (including critical  care time)  Medications Ordered in ED Medications  HYDROcodone-acetaminophen (NORCO/VICODIN) 5-325 MG per tablet 1 tablet (1 tablet Oral Given 09/29/16 1103)     Initial Impression / Assessment and Plan / ED Course  I have reviewed the triage vital signs and the nursing notes.  Pertinent labs & imaging results that were available during my care of the patient were reviewed by me and considered in my medical decision making (see chart for details).     Pt with back pain secondary to fall.  XR's neg for fx.  NV intact.  No focal neuro deficits, ambulates with slow, but steady gait.  Appears stable for d/c  Final Clinical Impressions(s) / ED Diagnoses   Final diagnoses:  Strain of lumbar region, initial encounter  Fall, initial encounter  Strain of neck muscle, initial encounter    New Prescriptions New Prescriptions   No medications on file     Bufford Lope 10/01/16 2209    Nat Christen, MD 10/15/16 1304

## 2016-09-29 NOTE — Discharge Instructions (Signed)
Apply ice packs on/off to your neck and back.  As discussed, follow-up with your primary doctor for recheck of the abdominal aneurysm and the back pain if not improving

## 2016-09-29 NOTE — ED Triage Notes (Signed)
Pt reports sitting in a chair this morning and the leg broke and he fell back.  C/o lower back pain going down both legs.

## 2016-09-29 NOTE — ED Notes (Signed)
Pt. Back from xray

## 2020-02-14 ENCOUNTER — Emergency Department (HOSPITAL_COMMUNITY): Payer: HRSA Program

## 2020-02-14 ENCOUNTER — Other Ambulatory Visit: Payer: Self-pay

## 2020-02-14 ENCOUNTER — Encounter (HOSPITAL_COMMUNITY): Payer: Self-pay | Admitting: Emergency Medicine

## 2020-02-14 ENCOUNTER — Emergency Department (HOSPITAL_COMMUNITY)
Admission: EM | Admit: 2020-02-14 | Discharge: 2020-02-14 | Disposition: A | Discharge status: Home / Self Care | Payer: HRSA Program | Attending: Emergency Medicine | Admitting: Emergency Medicine

## 2020-02-14 ENCOUNTER — Telehealth: Payer: Self-pay | Admitting: Critical Care Medicine

## 2020-02-14 DIAGNOSIS — I2510 Atherosclerotic heart disease of native coronary artery without angina pectoris: Secondary | ICD-10-CM | POA: Diagnosis not present

## 2020-02-14 DIAGNOSIS — I7143 Infrarenal abdominal aortic aneurysm, without rupture: Secondary | ICD-10-CM

## 2020-02-14 DIAGNOSIS — I10 Essential (primary) hypertension: Secondary | ICD-10-CM | POA: Insufficient documentation

## 2020-02-14 DIAGNOSIS — E119 Type 2 diabetes mellitus without complications: Secondary | ICD-10-CM | POA: Diagnosis not present

## 2020-02-14 DIAGNOSIS — R10819 Abdominal tenderness, unspecified site: Secondary | ICD-10-CM | POA: Insufficient documentation

## 2020-02-14 DIAGNOSIS — Z7982 Long term (current) use of aspirin: Secondary | ICD-10-CM | POA: Diagnosis not present

## 2020-02-14 DIAGNOSIS — Z79899 Other long term (current) drug therapy: Secondary | ICD-10-CM | POA: Insufficient documentation

## 2020-02-14 DIAGNOSIS — Z7984 Long term (current) use of oral hypoglycemic drugs: Secondary | ICD-10-CM | POA: Diagnosis not present

## 2020-02-14 DIAGNOSIS — R110 Nausea: Secondary | ICD-10-CM | POA: Diagnosis present

## 2020-02-14 DIAGNOSIS — F17210 Nicotine dependence, cigarettes, uncomplicated: Secondary | ICD-10-CM | POA: Diagnosis not present

## 2020-02-14 DIAGNOSIS — U071 COVID-19: Secondary | ICD-10-CM | POA: Diagnosis not present

## 2020-02-14 DIAGNOSIS — I714 Abdominal aortic aneurysm, without rupture: Secondary | ICD-10-CM | POA: Diagnosis not present

## 2020-02-14 LAB — URINALYSIS, ROUTINE W REFLEX MICROSCOPIC
Bilirubin Urine: NEGATIVE
Glucose, UA: 150 mg/dL — AB
Hgb urine dipstick: NEGATIVE
Ketones, ur: NEGATIVE mg/dL
Leukocytes,Ua: NEGATIVE
Nitrite: NEGATIVE
Protein, ur: NEGATIVE mg/dL
Specific Gravity, Urine: 1.01 (ref 1.005–1.030)
pH: 5 (ref 5.0–8.0)

## 2020-02-14 LAB — BASIC METABOLIC PANEL
Anion gap: 9 (ref 5–15)
BUN: 13 mg/dL (ref 8–23)
CO2: 23 mmol/L (ref 22–32)
Calcium: 8.8 mg/dL — ABNORMAL LOW (ref 8.9–10.3)
Chloride: 101 mmol/L (ref 98–111)
Creatinine, Ser: 0.85 mg/dL (ref 0.61–1.24)
GFR calc Af Amer: >60 mL/min (ref >60)
GFR calc non Af Amer: >60 mL/min (ref >60)
Glucose, Bld: 212 mg/dL — ABNORMAL HIGH (ref 70–99)
Potassium: 4.4 mmol/L (ref 3.5–5.1)
Sodium: 133 mmol/L — ABNORMAL LOW (ref 135–145)

## 2020-02-14 LAB — TSH: TSH: 3.371 u[IU]/mL (ref 0.350–4.500)

## 2020-02-14 LAB — CBC
HCT: 45.9 % (ref 39.0–52.0)
Hemoglobin: 15 g/dL (ref 13.0–17.0)
MCH: 29 pg (ref 26.0–34.0)
MCHC: 32.7 g/dL (ref 30.0–36.0)
MCV: 88.6 fL (ref 80.0–100.0)
Platelets: 244 10*3/uL (ref 150–400)
RBC: 5.18 MIL/uL (ref 4.22–5.81)
RDW: 14.7 % (ref 11.5–15.5)
WBC: 6.2 10*3/uL (ref 4.0–10.5)
nRBC: 0 % (ref 0.0–0.2)

## 2020-02-14 LAB — SARS CORONAVIRUS 2 BY RT PCR (HOSPITAL ORDER, PERFORMED IN ~~LOC~~ HOSPITAL LAB): SARS Coronavirus 2: POSITIVE — AB

## 2020-02-14 LAB — MAGNESIUM: Magnesium: 2 mg/dL (ref 1.7–2.4)

## 2020-02-14 LAB — TROPONIN I (HIGH SENSITIVITY)
Troponin I (High Sensitivity): 4 ng/L (ref <18)
Troponin I (High Sensitivity): 4 ng/L (ref <18)

## 2020-02-14 MED ORDER — IOHEXOL 300 MG/ML  SOLN
100.0000 mL | Freq: Once | INTRAMUSCULAR | Status: AC | PRN
  Administered 2020-02-14: 100 mL via INTRAVENOUS

## 2020-02-14 MED ORDER — ONDANSETRON 4 MG PO TBDP
4.0000 mg | ORAL_TABLET | Freq: Once | ORAL | Status: AC
  Administered 2020-02-14: 4 mg via ORAL
  Filled 2020-02-14: qty 1

## 2020-02-14 NOTE — Discharge Instructions (Signed)
You have been seen here for fever, chills, general malaise.  Lab work and imaging all look reassuring.  You tested positive for COVID-19.  I recommend taking over-the-counter pain medications like ibuprofen and or Tylenol every 6 as needed for pain and fever control.  Please follow dosing the back of bottle.  I also recommend staying hydrated as it can help ease your symptoms.  I have given the contact information for "post Covid care" please contact them for further information and management of your Covid symptoms.  I have also spoke with the covid infusion clinic, they will be calling you within the next few days to schedule your appointment for your treatment for Covid.  You must remain self quarantine for next 10 days since symptom onset.  CT scan showed you have a infrarenal abdominal aortic aneurysm which measures 3.7 cm previously it was 3.1 cm, recommend another CT scan in 2 years for reevaluation.  I want you to come back to  the emergency department if you develop chest pain, shortness of breath, severe abdominal pain, uncontrolled nausea, vomiting, diarrhea as these symptoms require further evaluation management.

## 2020-02-14 NOTE — Telephone Encounter (Signed)
Called to Discuss with patient about Covid symptoms and the use of the monoclonal antibody infusion for those with mild to moderate Covid symptoms and at a high risk of hospitalization.     Pt appears to qualify for this infusion due to co-morbid conditions and/or a member of an at-risk group in accordance with the FDA Emergency Use Authorization.    Unable to reach pt  No VM set up,  No answer to phone in chart

## 2020-02-14 NOTE — ED Triage Notes (Addendum)
Pt reports has been living with family that tested positive for covid. Pt reports "my heart is fluttering" and left flank pain radiating to umbilicus, intermittent chills,diarrhea since Thursday.

## 2020-02-14 NOTE — ED Provider Notes (Addendum)
[facility] EMERGENCY DEPARTMENT Provider Note   CSN: [id number] Arrival date & time: 02/14/20  3818     History Chief Complaint  Patient presents with  . Palpitations    Geoffrey HINCHLIFFE is a 64 y.o. male.  HPI   Patient with significant medical history of CAD, diabetes, hypertension, diverticulitis presents to the emergency department with chief complaint of nausea, general malaise, subjective chills x4 days.  Patient states he has been staying with his mother who recently tested positive for Covid.  He started to feel unwell on Thursday and had an episode of full body shaking on Friday which lasted about 30 minutes.  He denies becoming incontinent, losing control of his bowels, or having altered mental status.  He also admits to left lower abdominal pain that is worsening since Thursday.  He states he has a history of diverticulitis denies ever being hospitalized for it and is unsure if he has ever been on antibiotics for this.  Patient denies vomiting, dysuria, hematuria, hematochezia.  Patient denies any alleviating factors at this time.  Patient denies headache, fever, nasal congestion, sore throat, chest pain, shortness of breath, dysuria, worsening pedal edema.  Past Medical History:  Diagnosis Date  . CAD (coronary artery disease)    cardiac cath 2010      . Diabetes mellitus   . Dilatation of aorta (HCC)    Abdominal aortic dopplers on 04/17/2012 showed mild distal abdominal aortic dilatation measuring 3.0x3.2cm  . Hyperlipidemia   . Hypertension   . Obesity     Patient Active Problem List   Diagnosis Date Noted  . Tobacco dependence 04/03/2013  . Erectile dysfunction 04/03/2013  . CAD (coronary artery disease) 04/03/2013  . Degenerative disc disease, lumbar 05/27/2012  . Spinal stenosis 05/27/2012  . Impingement syndrome, shoulder 05/27/2012  . LLQ pain 08/28/2010  . HYPERLIPIDEMIA-MIXED 01/17/2009  . OVERWEIGHT/OBESITY 01/17/2009  . HYPERTENSION, UNSPECIFIED  01/17/2009  . CAD, NATIVE VESSEL 01/17/2009    Past Surgical History:  Procedure Laterality Date  . CARDIAC CATHETERIZATION     11/16/2008  2.5 x 15 XIENCE V drug-eluting stent  . KNEE SURGERY     Right       Family History  Problem Relation Age of Onset  . Diabetes Mother   . Diabetes Paternal Grandmother   . Diabetes Brother     Social History   Tobacco Use  . Smoking status: Current Every Day Smoker    Packs/day: 1.50    Years: 40.00    Pack years: 60.00    Types: Cigarettes  . Smokeless tobacco: Never Used  Substance Use Topics  . Alcohol use: Yes    Alcohol/week: 1.0 standard drink    Types: 1 Standard drinks or equivalent per week    Comment: one half of a fifth of crown on saturday nights  . Drug use: No    Home Medications Prior to Admission medications   Medication Sig Start Date End Date Taking? Authorizing Provider  aspirin 325 MG EC tablet Take 325 mg by mouth daily.     Yes [provider]  atorvastatin (LIPITOR) 10 MG tablet Take 10 mg by mouth at bedtime.   Yes [provider]  lisinopril (PRINIVIL,ZESTRIL) 2.5 MG tablet Take 2.5 mg by mouth daily.   Yes [provider]  metFORMIN (GLUCOPHAGE) 500 MG tablet Take 500 mg by mouth 2 (two) times daily with a meal.    Yes [provider]  HYDROcodone-acetaminophen (NORCO/VICODIN) 5-325 MG tablet Take  one-two tabs po q 4-6 hrs prn pain Patient not taking: Reported on 02/14/2020 09/29/16   Triplett, Tammy, PA-C  methocarbamol (ROBAXIN) 500 MG tablet Take 1 tablet (500 mg total) by mouth 3 (three) times daily. Patient not taking: Reported on 02/14/2020 09/29/16   Kem Parkinson, PA-C  naproxen (NAPROSYN) 500 MG tablet Take 1 tablet (500 mg total) by mouth 2 (two) times daily with a meal. Patient not taking: Reported on 02/14/2020 09/29/16   Kem Parkinson, PA-C    Allergies    Shellfish-derived products  Review of Systems   Review of Systems  Constitutional: Positive for  appetite change and chills. Negative for fever.  HENT: Positive for sore throat. Negative for congestion, tinnitus, trouble swallowing and voice change.   Eyes: Negative for visual disturbance.  Respiratory: Negative for cough and shortness of breath.   Cardiovascular: Negative for chest pain and palpitations.  Gastrointestinal: Positive for abdominal pain, diarrhea and nausea. Negative for vomiting.  Genitourinary: Negative for enuresis, flank pain, frequency, genital sores and hematuria.  Musculoskeletal: Negative for back pain.  Skin: Negative for rash.  Neurological: Negative for dizziness, light-headedness and headaches.  Hematological: Does not bruise/bleed easily.    Physical Exam Updated Vital Signs BP 133/70   Pulse (!) 42   Temp 97.8 F (36.6 C) (Oral)   Resp 17   Ht 6' (1.829 m)   Wt 95.3 kg   SpO2 95%   BMI 28.48 kg/m   Physical Exam Vitals and nursing note reviewed.  Constitutional:      General: He is not in acute distress.    Appearance: He is not ill-appearing.  HENT:     Head: Normocephalic and atraumatic.     Nose: No congestion.     Mouth/Throat:     Mouth: Mucous membranes are moist.     Pharynx: Oropharynx is clear. No oropharyngeal exudate or posterior oropharyngeal erythema.  Eyes:     General: No scleral icterus. Cardiovascular:     Rate and Rhythm: Normal rate and regular rhythm.     Pulses: Normal pulses.     Heart sounds: No murmur heard.  No friction rub. No gallop.   Pulmonary:     Effort: No respiratory distress.     Breath sounds: No wheezing, rhonchi or rales.  Abdominal:     General: There is no distension.     Palpations: Abdomen is soft.     Tenderness: There is abdominal tenderness. There is no guarding.     Comments: Patient's abdomen was visualized, nondistended, normoactive bowel sounds, dullness to percussion, tenderness upon palpation in the left lower quadrant as well as periumbilical area.  No peritoneal sign noted.    Musculoskeletal:        General: No swelling.     Right lower leg: No edema.     Left lower leg: No edema.  Skin:    General: Skin is warm and dry.     Capillary Refill: Capillary refill takes less than 2 seconds.     Findings: No rash.  Neurological:     General: No focal deficit present.     Mental Status: He is alert and oriented to person, place, and time.  Psychiatric:        Mood and Affect: Mood normal.     ED Results / Procedures / Treatments   Labs (all labs ordered are listed, but only abnormal results are displayed) Labs Reviewed  SARS CORONAVIRUS 2 BY RT PCR [facility] ORDER, PERFORMED IN  [place] [facility]) - Abnormal; Notable for the following components:      Result Value   SARS Coronavirus 2 POSITIVE (*)    All other components within normal limits  BASIC METABOLIC PANEL - Abnormal; Notable for the following components:   Sodium 133 (*)    Glucose, Bld 212 (*)    Calcium 8.8 (*)    All other components within normal limits  URINALYSIS, ROUTINE W REFLEX MICROSCOPIC - Abnormal; Notable for the following components:   Glucose, UA 150 (*)    All other components within normal limits  CBC  TSH  MAGNESIUM  TROPONIN I (HIGH SENSITIVITY)  TROPONIN I (HIGH SENSITIVITY)    EKG EKG Interpretation  Date/Time:  Sunday February 14 2020 09:15:28 EDT Ventricular Rate:  49 PR Interval:  168 QRS Duration: 100 QT Interval:  432 QTC Calculation: 390 R Axis:   68 Text Interpretation: Sinus bradycardia Otherwise normal ECG Poor baseline Confirmed by Elnora Morrison [phone]) on 02/14/2020 9:31:05 AM   Radiology CT Abdomen Pelvis W Contrast  Result Date: 02/14/2020 CLINICAL DATA:  Left flank pain with chills and diarrhea. Suspect diverticulitis. COVID-19 positive. EXAM: CT ABDOMEN AND PELVIS WITH CONTRAST TECHNIQUE: Multidetector CT imaging of the abdomen and pelvis was performed using the standard protocol following bolus administration of intravenous contrast.  CONTRAST:  174mL OMNIPAQUE IOHEXOL 300 MG/ML  SOLN COMPARISON:  08/29/2010 FINDINGS: Lower chest: Lung bases are clear. Minimal calcified plaque over the descending thoracic aorta. Hepatobiliary: Evidence of cholelithiasis with moderate size 1.7 cm gallstone present. No adjacent inflammatory change. Liver and biliary tree are normal. Pancreas: Normal. Spleen: Normal. Adrenals/Urinary Tract: Right adrenal gland is normal. 2.1 cm left adrenal mass which is low-density with Hounsfield unit measurements of 18 likely an adenoma. Kidneys are normal in size without hydronephrosis, focal mass or nephrolithiasis. Ureters and bladder are normal. Stomach/Bowel: Stomach and small bowel are normal. Appendix is normal. Colon is normal. There is no evidence of diverticulitis. Vascular/Lymphatic: Calcified plaque over the abdominal aorta. Aneurysmal dilatation of the infrarenal abdominal aorta measuring 3.7 cm in AP diameter (previously 3.1 cm) at the level of the takeoff of the inferior mesenteric artery. No adenopathy. Reproductive: Normal. Other: No free fluid or focal inflammatory change. Musculoskeletal: No acute findings. IMPRESSION: 1. No acute findings in the abdomen/pelvis. No evidence of diverticulitis. 2. Cholelithiasis. 3. 3.7 cm infrarenal abdominal aortic aneurysm (previously 3.1 cm). Recommend follow-up CT every 2 years. This recommendation follows ACR consensus guidelines: White Paper of the ACR Incidental Findings Committee II on Vascular Findings. J Am Coll Radiol 2013; 10:789-794. 4. 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 5. Aortic atherosclerosis. Aortic Atherosclerosis (ICD10-I70.0). Aortic aneurysm NOS (ICD10-I71.9). Electronically Signed   By: Marin Olp M.D.   On: 02/14/2020 13:35   DG Chest Portable 1 View  Result Date: 02/14/2020 CLINICAL DATA:  COVID exposure.Bradycardia.  Smoker.  Palpitations. EXAM: PORTABLE CHEST 1 VIEW COMPARISON:  02/29/2012. FINDINGS: Heart size appears normal. No  pleural effusion or edema identified. No airspace densities identified. Visualized osseous structures are unremarkable. IMPRESSION: No acute cardiopulmonary abnormalities. Electronically Signed   By: Kerby Moors M.D.   On: 02/14/2020 09:31    Procedures Procedures (including critical care time)  Medications Ordered in ED Medications  ondansetron (ZOFRAN-ODT) disintegrating tablet 4 mg (4 mg Oral Given 02/14/20 1131)  iohexol (OMNIPAQUE) 300 MG/ML solution 100 mL (100 mLs Intravenous Contrast Given 02/14/20 1240)    ED Course  I have reviewed the triage vital signs and  the nursing notes.  Pertinent labs & imaging results that were available during my care of the patient were reviewed by me and considered in my medical decision making (see chart for details).    MDM Rules/Calculators/A&P                          I have personally reviewed all imaging, labs and have interpreted them.  Patient presents with general malaise, chills, and left lower abdominal pain x4 days.  On exam he is alert and oriented, did not appear in acute distress, vital signs significant for bradycardia.  Patient's lung sounds were clear bilaterally, abdomen was submitted for left lower abdominal tenderness, negative peritoneal sign, no pedal edema noted.  Will order screening labs and CT abdomen for further evaluation.  BMP shows slight hyponatremia of 133, slight hyperglycemia of 212, no signs of metabolic acidosis, no signs of AKI.  CBC does not show leukocytosis or signs of anemia.  TSH 3.3, magnesium 2, delta troponin negative, UA negative for nitrates or leukocytes Chest x-ray does not show any acute abnormalities, EKG was sinus bradycardia without signs of ischemia no ST elevation or depression noted.  I have low suspicion patient suffering from systemic infection as patient is nontoxic-appearing, vital signs reassuring, no obvious source infection on exam.  Low suspicion patient some from a acute cardiac  abnormality as patient denies chest pain, shortness of breath, no signs of hypoperfusion or fluid overload on exam, EKG does not show ischemia.  I do not feel patient needs to be observed for his bradycardia, EKG does not show heart block, patient is not on any medications that would cause bradycardia, he is not symptomatic.  Will recommend he follows up with his cardiologist for further evaluation.  Low suspicion for acute abdomen requiring surgical intervention as patient denies nausea, vomiting, tolerating p.o. difficulty, no acute abdomen on exam.  CT scan does not show any acute abnormalities.  Does show infra renal abdominal aorta aneurysm 3.7 cm will notify patient and have him follow-up with primary doctor in 2 years time for reevaluation. I suspect patient's symptoms are secondary to being Covid positive.  I have low suspicion he require hospitalization as has no new oxygen requirements, no signs of respiratory stress on exam.  Due to his medical history will consult with the infusion clinic as I feel he would benefit from infusion.  Patient appears resting calmly, vital signs have remained stable, no indication for hospital admission.  Patient is given at home care and strict return precautions.  Patient verbalized understood and agreed to plan. Final Clinical Impression(s) / ED Diagnoses Final diagnoses:  [id number] virus infection  Aneurysm of infrarenal abdominal aorta [facility])    Rx / DC Orders ED Discharge Orders    None       Marcello Fennel, PA-C 02/14/20 1358    Shared service with APP.  I have personally seen and examined the patient, providing direct face to face care.  Physical exam findings and plan include patient presents with heart fluttering, generally not feeling well and tired.  Patient's had chills since Thursday.  Patient is not on any medication slowest heart rate no new medications.  On exam patient has heart rate 49, regular, no respiratory difficulty.  Patient's  blood work reassuring electrolytes within normal range.  Covid test returned positive discussed this with patient.  Discussed reasons to return and outpatient follow-up for his heart rate especially if he symptomatic.  No diagnosis found.    Elnora Morrison, MD 02/16/20 2326

## 2020-02-15 ENCOUNTER — Telehealth: Payer: Self-pay | Admitting: Physician Assistant

## 2020-02-15 NOTE — Telephone Encounter (Signed)
  Called to discuss with patient about Covid symptoms and the use of casirivimab/imdevimab, a monoclonal antibody infusion for those with mild to moderate Covid symptoms and at a high risk of hospitalization. Unable to reach the patient.   No voice mail to my chart.   Leanor Kail, PA - C

## 2020-03-16 ENCOUNTER — Other Ambulatory Visit: Payer: Self-pay

## 2020-03-16 DIAGNOSIS — Z20822 Contact with and (suspected) exposure to covid-19: Secondary | ICD-10-CM

## 2020-03-17 LAB — NOVEL CORONAVIRUS, NAA: SARS-CoV-2, NAA: NOT DETECTED

## 2020-03-17 LAB — SARS-COV-2, NAA 2 DAY TAT

## 2020-03-17 LAB — SPECIMEN STATUS REPORT

## 2020-03-24 ENCOUNTER — Ambulatory Visit: Payer: Self-pay

## 2020-07-28 ENCOUNTER — Emergency Department (HOSPITAL_COMMUNITY)
Admission: EM | Admit: 2020-07-28 | Discharge: 2020-07-29 | Disposition: A | Discharge status: Home / Self Care | Payer: Medicare Other | Attending: Emergency Medicine | Admitting: Emergency Medicine

## 2020-07-28 ENCOUNTER — Encounter (HOSPITAL_COMMUNITY): Payer: Self-pay

## 2020-07-28 ENCOUNTER — Other Ambulatory Visit: Payer: Self-pay

## 2020-07-28 DIAGNOSIS — E119 Type 2 diabetes mellitus without complications: Secondary | ICD-10-CM | POA: Diagnosis not present

## 2020-07-28 DIAGNOSIS — I10 Essential (primary) hypertension: Secondary | ICD-10-CM | POA: Diagnosis not present

## 2020-07-28 DIAGNOSIS — N3001 Acute cystitis with hematuria: Secondary | ICD-10-CM | POA: Diagnosis not present

## 2020-07-28 DIAGNOSIS — I2510 Atherosclerotic heart disease of native coronary artery without angina pectoris: Secondary | ICD-10-CM | POA: Insufficient documentation

## 2020-07-28 DIAGNOSIS — Z7982 Long term (current) use of aspirin: Secondary | ICD-10-CM | POA: Insufficient documentation

## 2020-07-28 DIAGNOSIS — Z7984 Long term (current) use of oral hypoglycemic drugs: Secondary | ICD-10-CM | POA: Diagnosis not present

## 2020-07-28 DIAGNOSIS — F17210 Nicotine dependence, cigarettes, uncomplicated: Secondary | ICD-10-CM | POA: Diagnosis not present

## 2020-07-28 DIAGNOSIS — Z951 Presence of aortocoronary bypass graft: Secondary | ICD-10-CM | POA: Insufficient documentation

## 2020-07-28 DIAGNOSIS — Z79899 Other long term (current) drug therapy: Secondary | ICD-10-CM | POA: Insufficient documentation

## 2020-07-28 DIAGNOSIS — N3091 Cystitis, unspecified with hematuria: Secondary | ICD-10-CM

## 2020-07-28 DIAGNOSIS — R319 Hematuria, unspecified: Secondary | ICD-10-CM | POA: Diagnosis present

## 2020-07-28 NOTE — ED Triage Notes (Signed)
Pt reports hematuria that started this afternoon. Pt reports mild left lower abd pain. Pt reports dysuria with blood clots.

## 2020-07-29 ENCOUNTER — Emergency Department (HOSPITAL_COMMUNITY): Payer: Medicare Other

## 2020-07-29 LAB — BASIC METABOLIC PANEL
Anion gap: 11 (ref 5–15)
BUN: 10 mg/dL (ref 8–23)
CO2: 22 mmol/L (ref 22–32)
Calcium: 9.1 mg/dL (ref 8.9–10.3)
Chloride: 104 mmol/L (ref 98–111)
Creatinine, Ser: 0.9 mg/dL (ref 0.61–1.24)
GFR, Estimated: >60 mL/min (ref >60)
Glucose, Bld: 202 mg/dL — ABNORMAL HIGH (ref 70–99)
Potassium: 3.5 mmol/L (ref 3.5–5.1)
Sodium: 137 mmol/L (ref 135–145)

## 2020-07-29 LAB — CBC WITH DIFFERENTIAL/PLATELET
Abs Immature Granulocytes: 0.05 10*3/uL (ref 0.00–0.07)
Basophils Absolute: 0.1 10*3/uL (ref 0.0–0.1)
Basophils Relative: 1 %
Eosinophils Absolute: 0.2 10*3/uL (ref 0.0–0.5)
Eosinophils Relative: 2 %
HCT: 44.6 % (ref 39.0–52.0)
Hemoglobin: 15.2 g/dL (ref 13.0–17.0)
Immature Granulocytes: 0 %
Lymphocytes Relative: 28 %
Lymphs Abs: 3.5 10*3/uL (ref 0.7–4.0)
MCH: 29.9 pg (ref 26.0–34.0)
MCHC: 34.1 g/dL (ref 30.0–36.0)
MCV: 87.6 fL (ref 80.0–100.0)
Monocytes Absolute: 0.9 10*3/uL (ref 0.1–1.0)
Monocytes Relative: 7 %
Neutro Abs: 7.6 10*3/uL (ref 1.7–7.7)
Neutrophils Relative %: 62 %
Platelets: 343 10*3/uL (ref 150–400)
RBC: 5.09 MIL/uL (ref 4.22–5.81)
RDW: 14.7 % (ref 11.5–15.5)
WBC: 12.4 10*3/uL — ABNORMAL HIGH (ref 4.0–10.5)
nRBC: 0 % (ref 0.0–0.2)

## 2020-07-29 LAB — PROTIME-INR
INR: 1 (ref 0.8–1.2)
Prothrombin Time: 12.7 seconds (ref 11.4–15.2)

## 2020-07-29 LAB — URINALYSIS, ROUTINE W REFLEX MICROSCOPIC
Bacteria, UA: NONE SEEN
Bilirubin Urine: NEGATIVE
Glucose, UA: >=500 mg/dL — AB
Ketones, ur: NEGATIVE mg/dL
Leukocytes,Ua: NEGATIVE
Nitrite: NEGATIVE
Protein, ur: 30 mg/dL — AB
RBC / HPF: >50 RBC/hpf — ABNORMAL HIGH (ref 0–5)
Specific Gravity, Urine: 1.003 — ABNORMAL LOW (ref 1.005–1.030)
pH: 6 (ref 5.0–8.0)

## 2020-07-29 MED ORDER — CEFTRIAXONE SODIUM 1 G IJ SOLR
1.0000 g | Freq: Once | INTRAMUSCULAR | Status: AC
  Administered 2020-07-29: 1 g via INTRAMUSCULAR
  Filled 2020-07-29: qty 10

## 2020-07-29 MED ORDER — CEPHALEXIN 500 MG PO CAPS: 500.0000 mg | ORAL_CAPSULE | Freq: Four times a day (QID) | ORAL | 0 refills | Status: DC

## 2020-07-29 NOTE — ED Provider Notes (Signed)
[facility] EMERGENCY DEPARTMENT Provider Note   CSN: [id number] Arrival date & time: 07/28/20  2314     History Chief Complaint  Patient presents with  . Hematuria    [name] is a 65 y.o. male.  Patient is a 64 year old male with history of coronary artery disease, diabetes, hypertension, hyperlipidemia. Patient presents today for evaluation of hematuria. Patient states he urinated this afternoon and there was blood in it. This evening he urinated again and noticed it was somewhat difficult to initiate his urine stream and that he passed some clots and bloody urine. He describes some dysuria, but denies any fevers or chills. He does report some right-sided back pain for the past couple of days, but does heavy lifting at work and believes he pulled a muscle. He denies history of kidney stones. Patient takes aspirin but denies other blood thinners.  The history is provided by the patient.  Hematuria This is a new problem. Episode onset: This afternoon. The problem occurs constantly. The problem has not changed since onset.Pertinent negatives include no chest pain and no shortness of breath. Nothing aggravates the symptoms. Nothing relieves the symptoms.       Past Medical History:  Diagnosis Date  . CAD (coronary artery disease)    cardiac cath 2010      . Diabetes mellitus   . Dilatation of aorta (HCC)    Abdominal aortic dopplers on 04/17/2012 showed mild distal abdominal aortic dilatation measuring 3.0x3.2cm  . Hyperlipidemia   . Hypertension   . Obesity     Patient Active Problem List   Diagnosis Date Noted  . Tobacco dependence 04/03/2013  . Erectile dysfunction 04/03/2013  . CAD (coronary artery disease) 04/03/2013  . Degenerative disc disease, lumbar 05/27/2012  . Spinal stenosis 05/27/2012  . Impingement syndrome, shoulder 05/27/2012  . LLQ pain 08/28/2010  . HYPERLIPIDEMIA-MIXED 01/17/2009  . OVERWEIGHT/OBESITY 01/17/2009  . HYPERTENSION, UNSPECIFIED  01/17/2009  . CAD, NATIVE VESSEL 01/17/2009    Past Surgical History:  Procedure Laterality Date  . CARDIAC CATHETERIZATION     11/16/2008  2.5 x 15 XIENCE V drug-eluting stent  . KNEE SURGERY     Right       Family History  Problem Relation Age of Onset  . Diabetes Mother   . Diabetes Paternal Grandmother   . Diabetes Brother     Social History   Tobacco Use  . Smoking status: Current Every Day Smoker    Packs/day: 1.50    Years: 40.00    Pack years: 60.00    Types: Cigarettes  . Smokeless tobacco: Never Used  Substance Use Topics  . Alcohol use: Yes    Alcohol/week: 1.0 standard drink    Types: 1 Standard drinks or equivalent per week    Comment: one half of a fifth of crown on saturday nights  . Drug use: No    Home Medications Prior to Admission medications   Medication Sig Start Date End Date Taking? Authorizing Provider  aspirin 325 MG EC tablet Take 325 mg by mouth daily.      [provider]  atorvastatin (LIPITOR) 10 MG tablet Take 10 mg by mouth at bedtime.    [provider]  HYDROcodone-acetaminophen (NORCO/VICODIN) 5-325 MG tablet Take one-two tabs po q 4-6 hrs prn pain Patient not taking: Reported on 02/14/2020 09/29/16   Triplett, Tammy, PA-C  lisinopril (PRINIVIL,ZESTRIL) 2.5 MG tablet Take 2.5 mg by mouth daily.    [provider]  metFORMIN (GLUCOPHAGE)  500 MG tablet Take 500 mg by mouth 2 (two) times daily with a meal.     [provider]  methocarbamol (ROBAXIN) 500 MG tablet Take 1 tablet (500 mg total) by mouth 3 (three) times daily. Patient not taking: Reported on 02/14/2020 09/29/16   Kem Parkinson, PA-C  naproxen (NAPROSYN) 500 MG tablet Take 1 tablet (500 mg total) by mouth 2 (two) times daily with a meal. Patient not taking: Reported on 02/14/2020 09/29/16   Kem Parkinson, PA-C    Allergies    Shellfish-derived products  Review of Systems   Review of Systems  Respiratory: Negative for shortness of  breath.   Cardiovascular: Negative for chest pain.  Genitourinary: Positive for hematuria.  All other systems reviewed and are negative.   Physical Exam Updated Vital Signs BP (!) 147/70 (BP Location: Right Arm)   Pulse (!) 56   Temp 97.9 F (36.6 C) (Oral)   Resp 17   Ht 6' (1.829 m)   Wt 95.3 kg   SpO2 98%   BMI 28.48 kg/m   Physical Exam Vitals and nursing note reviewed.  Constitutional:      General: He is not in acute distress.    Appearance: He is well-developed and well-nourished. He is not diaphoretic.  HENT:     Head: Normocephalic and atraumatic.     Mouth/Throat:     Mouth: Oropharynx is clear and moist.  Cardiovascular:     Rate and Rhythm: Normal rate and regular rhythm.     Heart sounds: No murmur heard. No friction rub.  Pulmonary:     Effort: Pulmonary effort is normal. No respiratory distress.     Breath sounds: Normal breath sounds. No wheezing or rales.  Abdominal:     General: Bowel sounds are normal. There is no distension.     Palpations: Abdomen is soft.     Tenderness: There is no abdominal tenderness. There is right CVA tenderness. There is no left CVA tenderness, guarding or rebound.  Musculoskeletal:        General: No edema. Normal range of motion.     Cervical back: Normal range of motion and neck supple.  Skin:    General: Skin is warm and dry.  Neurological:     Mental Status: He is alert and oriented to person, place, and time.     Coordination: Coordination normal.     ED Results / Procedures / Treatments   Labs (all labs ordered are listed, but only abnormal results are displayed) Labs Reviewed  URINALYSIS, ROUTINE W REFLEX MICROSCOPIC - Abnormal; Notable for the following components:      Result Value   Color, Urine AMBER (*)    Specific Gravity, Urine 1.003 (*)    Glucose, UA >=500 (*)    Hgb urine dipstick MODERATE (*)    Protein, ur 30 (*)    RBC / HPF >50 (*)    All other components within normal limits     EKG None  Radiology No results found.  Procedures Procedures   Medications Ordered in ED Medications - No data to display  ED Course  I have reviewed the triage vital signs and the nursing notes.  Pertinent labs & imaging results that were available during my care of the patient were reviewed by me and considered in my medical decision making (see chart for details).    MDM Rules/Calculators/A&P  Patient presenting here with complaints of hematuria since earlier this afternoon. He reports passing clots and  blood-tinged urine this evening. Patient's urinalysis shows only blood. CT scan showing no evidence for mass or calculus with no explanation for his bleeding identified.  Patient to be treated as a hemorrhagic cystitis. He will be given Rocephin here along with a prescription for Keflex. I also feel as though patient should follow up with urology to further investigate.  Final Clinical Impression(s) / ED Diagnoses Final diagnoses:  None    Rx / DC Orders ED Discharge Orders    None       Veryl Speak, MD 07/29/20 0157

## 2020-07-29 NOTE — Discharge Instructions (Signed)
Begin taking Keflex as prescribed.  Call [facility] tomorrow morning to arrange a follow-up appointment. Their contact information has been provided in this discharge summary for you to call and make these arrangements.  Return to the emergency department if you develop severe pain, worsening bleeding, and inability to urinate, or other new and concerning symptoms.

## 2020-08-01 ENCOUNTER — Ambulatory Visit: Payer: Self-pay | Admitting: Family Medicine

## 2020-08-15 ENCOUNTER — Other Ambulatory Visit: Payer: Self-pay | Admitting: *Deleted

## 2020-08-15 DIAGNOSIS — N3000 Acute cystitis without hematuria: Secondary | ICD-10-CM

## 2020-08-16 ENCOUNTER — Ambulatory Visit: Payer: Self-pay | Admitting: Urology

## 2020-08-22 ENCOUNTER — Other Ambulatory Visit: Payer: Self-pay | Admitting: *Deleted

## 2020-08-22 DIAGNOSIS — N3000 Acute cystitis without hematuria: Secondary | ICD-10-CM

## 2020-08-23 ENCOUNTER — Other Ambulatory Visit: Payer: Self-pay

## 2020-08-23 ENCOUNTER — Other Ambulatory Visit
Admission: RE | Admit: 2020-08-23 | Discharge: 2020-08-23 | Disposition: A | Discharge status: Home / Self Care | Payer: Medicare Other | Attending: Urology | Admitting: Urology

## 2020-08-23 ENCOUNTER — Encounter: Payer: Self-pay | Admitting: Urology

## 2020-08-23 ENCOUNTER — Ambulatory Visit (INDEPENDENT_AMBULATORY_CARE_PROVIDER_SITE_OTHER): Payer: Self-pay | Admitting: Urology

## 2020-08-23 VITALS — BP 157/73 | HR 47 | Ht 72.0 in | Wt 217.0 lb

## 2020-08-23 DIAGNOSIS — R310 Gross hematuria: Secondary | ICD-10-CM

## 2020-08-23 DIAGNOSIS — N3000 Acute cystitis without hematuria: Secondary | ICD-10-CM | POA: Diagnosis present

## 2020-08-23 LAB — URINALYSIS, COMPLETE (UACMP) WITH MICROSCOPIC
Bilirubin Urine: NEGATIVE
Glucose, UA: 250 mg/dL — AB
Hgb urine dipstick: NEGATIVE
Ketones, ur: NEGATIVE mg/dL
Leukocytes,Ua: NEGATIVE
Nitrite: NEGATIVE
Protein, ur: NEGATIVE mg/dL
Specific Gravity, Urine: 1.01 (ref 1.005–1.030)
pH: 6 (ref 5.0–8.0)

## 2020-08-23 NOTE — Patient Instructions (Signed)
Cystoscopy Cystoscopy is a procedure that is used to help diagnose and sometimes treat conditions that affect the lower urinary tract. The lower urinary tract includes the bladder and the urethra. The urethra is the tube that drains urine from the bladder. Cystoscopy is done using a thin, tube-shaped instrument with a light and camera at the end (cystoscope). The cystoscope may be hard or flexible, depending on the goal of the procedure. The cystoscope is inserted through the urethra, into the bladder. Cystoscopy may be recommended if you have:  Urinary tract infections that keep coming back.  Blood in the urine (hematuria).  An inability to control when you urinate (urinary incontinence) or an overactive bladder.  Unusual cells found in a urine sample.  A blockage in the urethra, such as a urinary stone.  Painful urination.  An abnormality in the bladder found during an intravenous pyelogram (IVP) or CT scan. Cystoscopy may also be done to remove a sample of tissue to be examined under a microscope (biopsy). Tell a health care provider about:  Any allergies you have.  All medicines you are taking, including vitamins, herbs, eye drops, creams, and over-the-counter medicines.  Any problems you or family members have had with anesthetic medicines.  Any blood disorders you have.  Any surgeries you have had.  Any medical conditions you have.  Whether you are pregnant or may be pregnant. What are the risks? Generally, this is a safe procedure. However, problems may occur, including:  Infection.  Bleeding.  Allergic reactions to medicines.  Damage to other structures or organs. What happens before the procedure? Medicines Ask your health care provider about:  Changing or stopping your regular medicines. This is especially important if you are taking diabetes medicines or blood thinners.  Taking medicines such as aspirin and ibuprofen. These medicines can thin your blood. Do  not take these medicines unless your health care provider tells you to take them.  Taking over-the-counter medicines, vitamins, herbs, and supplements. Tests You may have an exam or testing, such as:  X-rays of the bladder, urethra, or kidneys.  CT scan of the abdomen or pelvis.  Urine tests to check for signs of infection. General instructions  Follow instructions from your health care provider about eating or drinking restrictions.  Ask your health care provider what steps will be taken to help prevent infection. These steps may include: ? Washing skin with a germ-killing soap. ? Taking antibiotic medicine.  Plan to have a responsible adult take you home from the hospital or clinic. What happens during the procedure?  You will be given one or more of the following: ? A medicine to help you relax (sedative). ? A medicine to numb the area (local anesthetic).  The area around the opening of your urethra will be cleaned.  The cystoscope will be passed through your urethra into your bladder.  Germ-free (sterile) fluid will flow through the cystoscope to fill your bladder. The fluid will stretch your bladder so that your health care provider can clearly examine your bladder walls.  Your doctor will look at the urethra and bladder. Your doctor may take a biopsy or remove stones.  The cystoscope will be removed, and your bladder will be emptied. The procedure may vary among health care providers and hospitals.   What can I expect after the procedure? After the procedure, it is common to have:  Some soreness or pain in your abdomen and urethra.  Urinary symptoms. These include: ? Mild pain or burning  when you urinate. Pain should stop within a few minutes after you urinate. This may last for up to 1 week. ? A small amount of blood in your urine for several days. ? Feeling like you need to urinate but producing only a small amount of urine. Follow these instructions at  home: Medicines  Take over-the-counter and prescription medicines only as told by your health care provider.  If you were prescribed an antibiotic medicine, take it as told by your health care provider. Do not stop taking the antibiotic even if you start to feel better. General instructions  Return to your normal activities as told by your health care provider. Ask your health care provider what activities are safe for you.  If you were given a sedative during the procedure, it can affect you for several hours. Do not drive or operate machinery until your health care provider says that it is safe.  Watch for any blood in your urine. If the amount of blood in your urine increases, call your health care provider.  Follow instructions from your health care provider about eating or drinking restrictions.  If a tissue sample was removed for testing (biopsy) during your procedure, it is up to you to get your test results. Ask your health care provider, or the department that is doing the test, when your results will be ready.  Drink enough fluid to keep your urine pale yellow.  Keep all follow-up visits. This is important. Contact a health care provider if:  You have pain that gets worse or does not get better with medicine, especially pain when you urinate.  You have trouble urinating.  You have more blood in your urine. Get help right away if:  You have blood clots in your urine.  You have abdominal pain.  You have a fever or chills.  You are unable to urinate. Summary  Cystoscopy is a procedure that is used to help diagnose and sometimes treat conditions that affect the lower urinary tract.  Cystoscopy is done using a thin, tube-shaped instrument with a light and camera at the end.  After the procedure, it is common to have some soreness or pain in your abdomen and urethra.  Watch for any blood in your urine. If the amount of blood in your urine increases, call your health  care provider.  If you were prescribed an antibiotic medicine, take it as told by your health care provider. Do not stop taking the antibiotic even if you start to feel better. This information is not intended to replace advice given to you by your health care provider. Make sure you discuss any questions you have with your health care provider. Document Revised: 12/25/2019 Document Reviewed: 12/25/2019 Elsevier Patient Education  [place].

## 2020-08-23 NOTE — Progress Notes (Signed)
08/23/20 9:38 AM   [name] [birth year] [id number]  CC: Gross hematuria  HPI: I saw [name] in urology clinic for the above issues.  He is a 65 year old male with past medical history notable for CAD and diabetes who presented to the ED on 07/29/2020 with gross hematuria and clots.  He had a noncontrast CT performed which was benign, and urinalysis showed greater than 50 RBCs, 6-10 WBCs, no bacteria, no leukocytes, nitrite negative.  This was not sent for culture.  He was treated with antibiotics.  He was not having any urinary symptoms at that time aside from the blood.  He denies any prior episodes of gross hematuria.  He also had a CT scan with contrast performed in September 2021 for abdominal pain, and that showed no enhancing renal masses or filling defects on the delayed films.  He does report a fall from standing earlier in the day when he presented to the ED, but he did not think much of this.  He has an extensive smoking history with 2 packs a day for over 50 years.  He denies any family history of urologic malignancies.  Urinalysis today is completely benign.   PMH: Past Medical History:  Diagnosis Date  . CAD (coronary artery disease)    cardiac cath 2010      . Diabetes mellitus   . Dilatation of aorta (HCC)    Abdominal aortic dopplers on 04/17/2012 showed mild distal abdominal aortic dilatation measuring 3.0x3.2cm  . Hyperlipidemia   . Hypertension   . Obesity     Surgical History: Past Surgical History:  Procedure Laterality Date  . CARDIAC CATHETERIZATION     11/16/2008  2.5 x 15 XIENCE V drug-eluting stent  . KNEE SURGERY     Right    Family History: Family History  Problem Relation Age of Onset  . Diabetes Mother   . Diabetes Paternal Grandmother   . Diabetes Brother     Social History:  reports that he has been smoking cigarettes. He has a 60.00 pack-year smoking history. He has never used smokeless tobacco. He reports current alcohol use of  about 1.0 standard drink of alcohol per week. He reports that he does not use drugs.  Physical Exam: BP (!) 157/73   Pulse (!) 47   Ht 6' (1.829 m)   Wt 217 lb (98.4 kg)   BMI 29.43 kg/m    Constitutional:  Alert and oriented, No acute distress. Cardiovascular: No clubbing, cyanosis, or edema. Respiratory: Normal respiratory effort, no increased work of breathing. GI: Abdomen is soft, nontender, nondistended, no abdominal masses GU: Phallus with patent meatus, no lesions, testicles 20 cc and descended bilaterally without masses  Laboratory Data: Reviewed, see HPI Pertinent Imaging: I have personally viewed and interpreted both the CT scan with contrast in September 2021 and the most recent CT without contrast.  Stable left adrenal adenoma without evidence of enhancement, no renal masses or filling defects, no nephrolithiasis or hydronephrosis  Assessment & Plan:   65 year old male with 1 day of gross hematuria with clots after a fall from standing, and negative CT scan x2.  He has an extensive 90-pack-year smoking history.  We reviewed the AUA guidelines regarding work-up of gross hematuria.  I think the most likely etiology was his fall from standing based on the fact that his urinalysis is completely benign today and his hematuria cleared within 24 hours.  However, with his extensive smoking history I do think cystoscopy and cytology  is warranted to rule out any bladder malignancy.  He would like to have this performed in [place].  We will facilitate cystoscopy and cytology for completion of gross hematuria work-up in Irean Hong, MD 08/23/2020  [facility] [address] [place], [place] [address] [phone]

## 2020-09-26 ENCOUNTER — Ambulatory Visit: Payer: Medicare Other | Admitting: Urology

## 2020-10-14 ENCOUNTER — Other Ambulatory Visit: Payer: Self-pay

## 2020-10-14 ENCOUNTER — Ambulatory Visit (INDEPENDENT_AMBULATORY_CARE_PROVIDER_SITE_OTHER): Payer: Medicare Other | Admitting: Urology

## 2020-10-14 ENCOUNTER — Encounter: Payer: Self-pay | Admitting: Urology

## 2020-10-14 VITALS — BP 125/63 | HR 60 | Ht 72.0 in | Wt 210.0 lb

## 2020-10-14 DIAGNOSIS — R310 Gross hematuria: Secondary | ICD-10-CM | POA: Diagnosis not present

## 2020-10-14 LAB — URINALYSIS, ROUTINE W REFLEX MICROSCOPIC
Bilirubin, UA: NEGATIVE
Ketones, UA: NEGATIVE
Leukocytes,UA: NEGATIVE
Nitrite, UA: NEGATIVE
RBC, UA: NEGATIVE
Specific Gravity, UA: 1.02 (ref 1.005–1.030)
Urobilinogen, Ur: 0.2 mg/dL (ref 0.2–1.0)
pH, UA: 6.5 (ref 5.0–7.5)

## 2020-10-14 LAB — MICROSCOPIC EXAMINATION
Bacteria, UA: NONE SEEN
Epithelial Cells (non renal): NONE SEEN /hpf (ref 0–10)
RBC, Urine: NONE SEEN /hpf (ref 0–2)
Renal Epithel, UA: NONE SEEN /hpf
WBC, UA: NONE SEEN /hpf (ref 0–5)

## 2020-10-14 MED ORDER — FINASTERIDE 5 MG PO TABS: 5.0000 mg | ORAL_TABLET | Freq: Every day | ORAL | 3 refills | Status: DC

## 2020-10-14 MED ORDER — CIPROFLOXACIN HCL 500 MG PO TABS
500.0000 mg | ORAL_TABLET | Freq: Once | ORAL | Status: AC
Start: 2020-10-14 — End: 2020-10-14
  Administered 2020-10-14: 500 mg via ORAL

## 2020-10-14 NOTE — Progress Notes (Signed)
Urological Symptom Review  Patient is experiencing the following symptoms: Frequent urination Get up at night to urinate Stream starts and stops Erection problems (male only)   Review of Systems  Gastrointestinal (upper)  : Negative for upper GI symptoms  Gastrointestinal (lower) : Negative for lower GI symptoms  Constitutional : Night Sweats  Skin: Negative for skin symptoms  Eyes: Negative for eye symptoms  Ear/Nose/Throat : Negative for Ear/Nose/Throat symptoms  Hematologic/Lymphatic: Easy bruising  Cardiovascular : Negative for cardiovascular symptoms  Respiratory : Shortness of breath  Endocrine: Excessive thirst  Musculoskeletal: Back pain Joint pain  Neurological: Negative for neurological symptoms  Psychologic: Negative for psychiatric symptoms

## 2020-10-14 NOTE — Patient Instructions (Signed)

## 2020-10-14 NOTE — Progress Notes (Signed)
   10/14/20  CC: gross hematuria  HPI: [name] is a 65yo here for followup for gross hematuria.   Blood pressure 125/63, pulse 60, height 6' (1.829 m), weight 210 lb (95.3 kg). NED. A&Ox3.   No respiratory distress   Abd soft, NT, ND Normal phallus with bilateral descended testicles  Cystoscopy Procedure Note  Patient identification was confirmed, informed consent was obtained, and patient was prepped using Betadine solution.  Lidocaine jelly was administered per urethral meatus.     Pre-Procedure: - Inspection reveals a normal caliber ureteral meatus.  Procedure: The flexible cystoscope was introduced without difficulty - No urethral strictures/lesions are present. - Enlarged prostate tortuous vessels prostatic urethra - Normal bladder neck - Bilateral ureteral orifices identified - Bladder mucosa  reveals no ulcers, tumors, or lesions - No bladder stones - No trabeculation  Retroflexion shows 1cm intravesical prostatatic protrusion   Post-Procedure: - Patient tolerated the procedure well  Assessment/ Plan: We will start finasteride 5 mg daily  No follow-ups on file.  Nicolette Bang, MD

## 2020-10-17 LAB — CYTOLOGY, URINE

## 2020-10-26 NOTE — Progress Notes (Signed)
Results sent via my chart 

## 2020-11-10 ENCOUNTER — Encounter: Payer: Self-pay | Admitting: Internal Medicine

## 2021-04-24 ENCOUNTER — Ambulatory Visit: Payer: Medicare Other | Admitting: Urology

## 2021-04-24 DIAGNOSIS — R310 Gross hematuria: Secondary | ICD-10-CM

## 2021-09-06 IMAGING — CT CT ABD-PELV W/ CM
2 of 6 series · 13 of 46 positions shown, 15 images · IV contrast (Omnipaque or Isovue)
Comparison: 08/29/2010

CLINICAL DATA: Left flank pain with chills and diarrhea. Suspect
diverticulitis. 2AITP-HB positive.

EXAM:
CT ABDOMEN AND PELVIS WITH CONTRAST
TECHNIQUE: Multidetector CT imaging of the abdomen and pelvis was performed
using the standard protocol following bolus administration of
intravenous contrast.
CONTRAST:  100mL OMNIPAQUE IOHEXOL 300 MG/ML  SOLN

[Series 2: axial st · axial · 0.74mm/px · z∈[+810,+1214]mm · 10 of 101 slices shown, 12 images]
[im 10/101  soft-tissue]
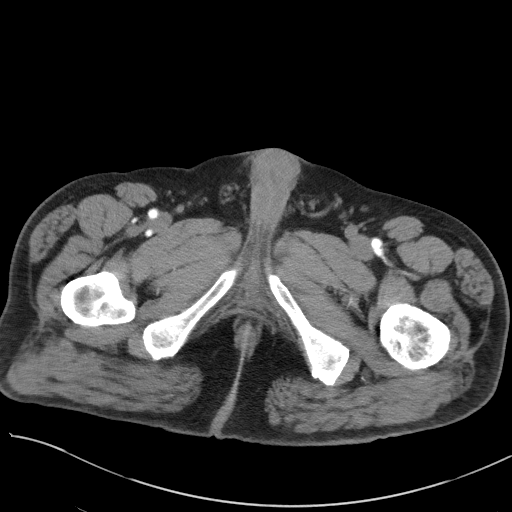
[im 10/101  bone]
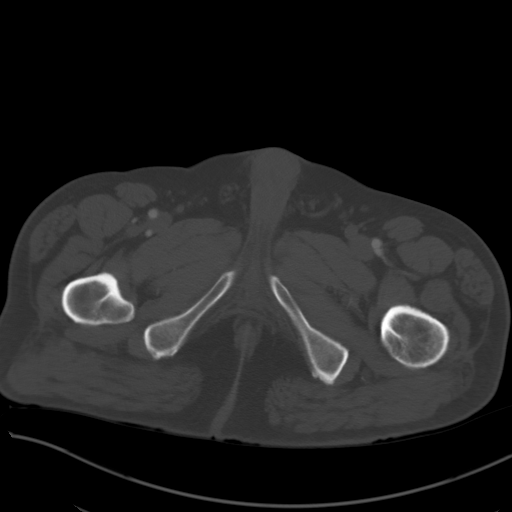
[im 19/101  soft-tissue]
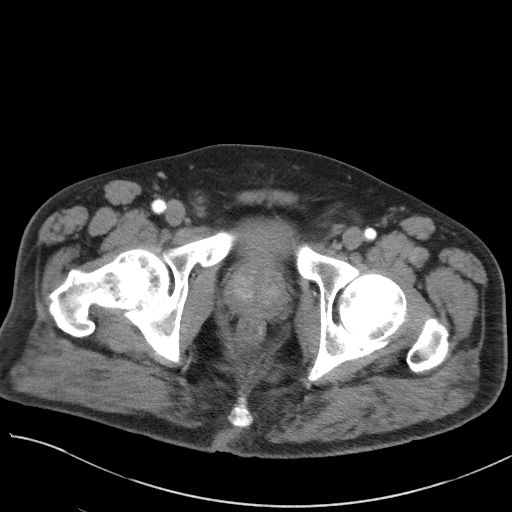
[im 28/101  soft-tissue]
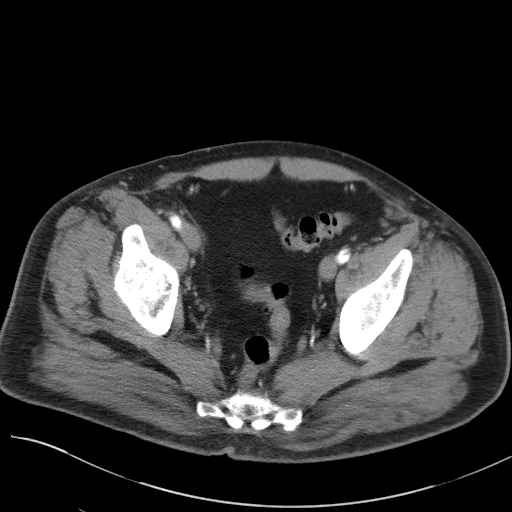
[im 37/101  soft-tissue]
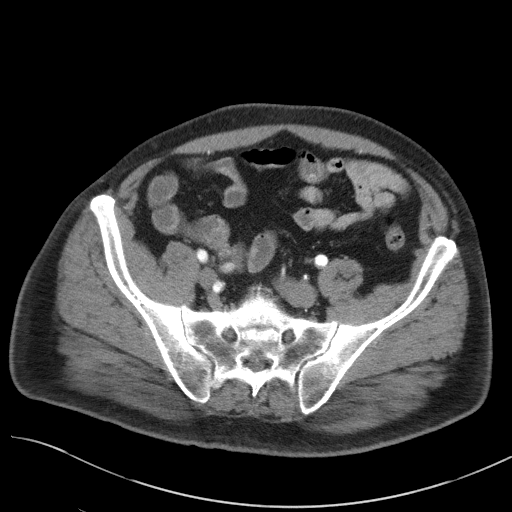
[im 46/101  soft-tissue]
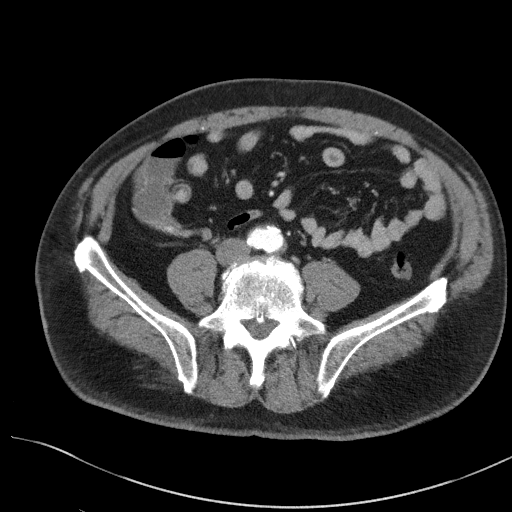
[im 55/101  soft-tissue]
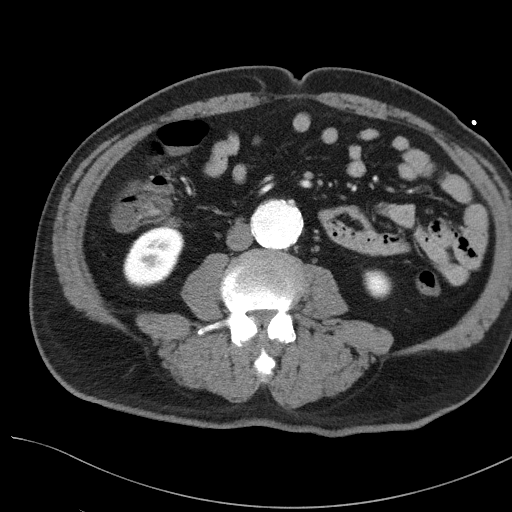
[im 64/101  soft-tissue]
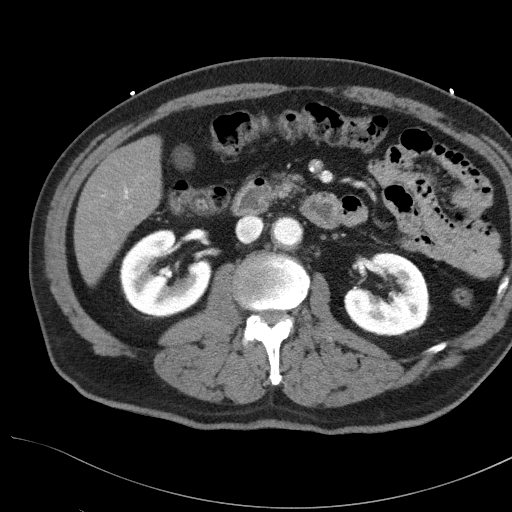
[im 73/101  soft-tissue]
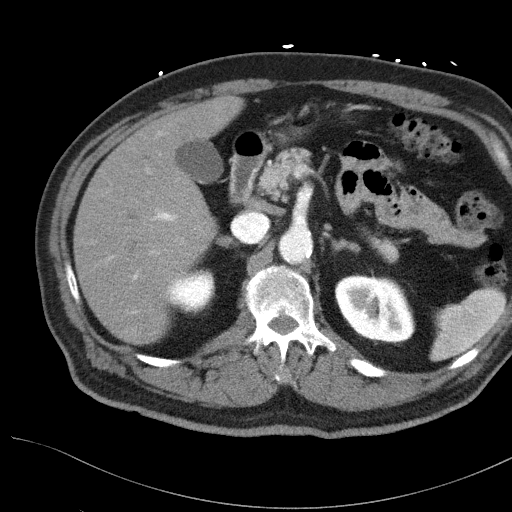
[im 82/101  soft-tissue]
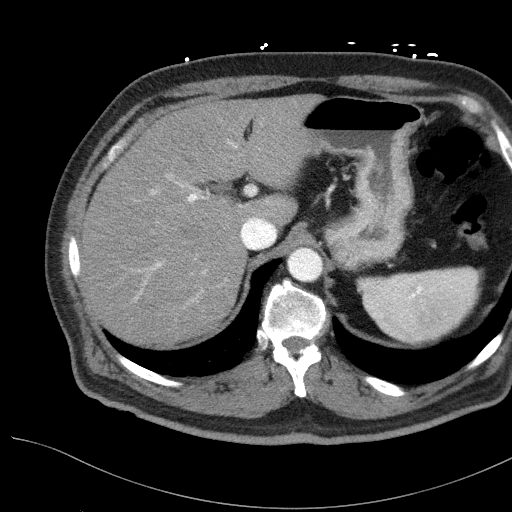
[im 82/101  bone]
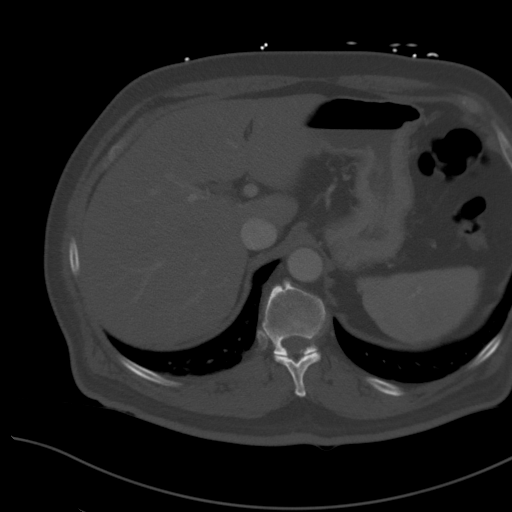
[im 91/101  soft-tissue]
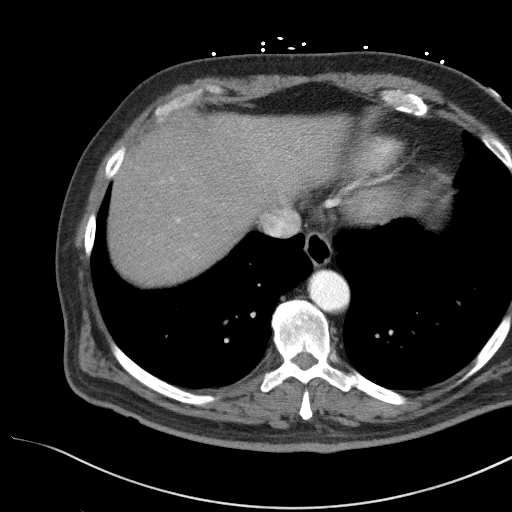

[Series 6: coronal st · coronal · 0.87mm/px · 3 of 117 slices shown]
[im 39/117  soft-tissue]
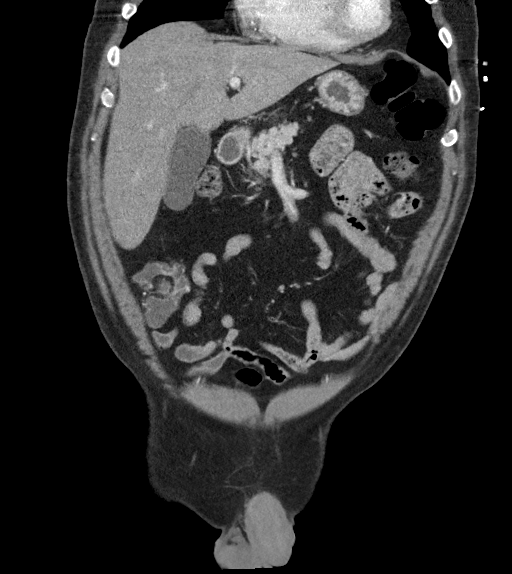
[im 52/117  soft-tissue]
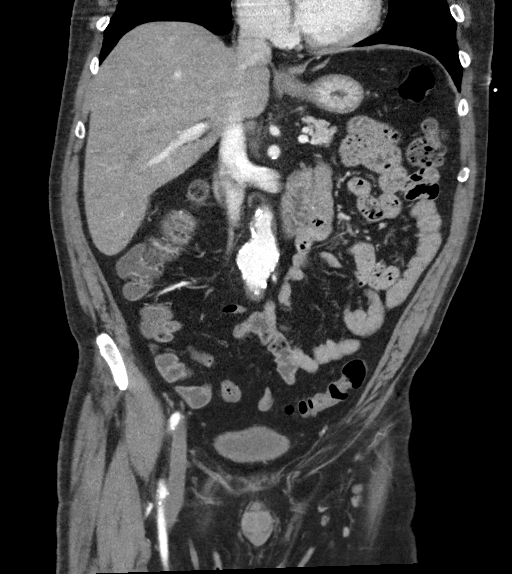
[im 65/117  soft-tissue]
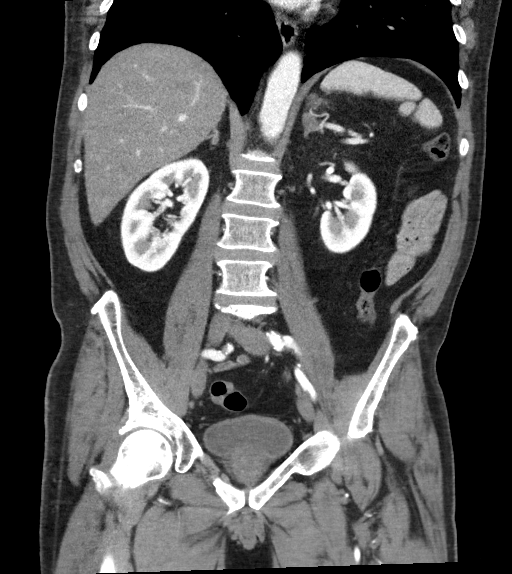

[13 of 46 positions shown; findings below may reference images not displayed]

FINDINGS: Lower chest: Lung bases are clear. Minimal calcified plaque over the
descending thoracic aorta.

Hepatobiliary: Evidence of cholelithiasis with moderate size 1.7 cm
gallstone present. No adjacent inflammatory change. Liver and
biliary tree are normal.

Pancreas: Normal.

Spleen: Normal.

Adrenals/Urinary Tract: Right adrenal gland is normal. 2.1 cm left
adrenal mass which is low-density with Hounsfield unit measurements
of 18 likely an adenoma. Kidneys are normal in size without
hydronephrosis, focal mass or nephrolithiasis. Ureters and bladder
are normal.

Stomach/Bowel: Stomach and small bowel are normal. Appendix is
normal. Colon is normal. There is no evidence of diverticulitis.

Vascular/Lymphatic: Calcified plaque over the abdominal aorta.
Aneurysmal dilatation of the infrarenal abdominal aorta measuring
3.7 cm in AP diameter (previously 3.1 cm) at the level of the
takeoff of the inferior mesenteric artery. No adenopathy.

Reproductive: Normal.

Other: No free fluid or focal inflammatory change.

Musculoskeletal: No acute findings.
IMPRESSION: 1. No acute findings in the abdomen/pelvis. No evidence of
diverticulitis.
2. Cholelithiasis.
3. 3.7 cm infrarenal abdominal aortic aneurysm (previously 3.1 cm).
Recommend follow-up CT every 2 years. This recommendation follows
ACR consensus guidelines: White Paper of the ACR Incidental Findings
Committee II on Vascular Findings. [HOSPITAL] 5097;
[DATE].
4. 2.1 cm left adrenal mass likely an adenoma.
5. Aortic atherosclerosis.

Aortic Atherosclerosis (M97HF-NQ6.6). Aortic aneurysm NOS
(M97HF-9QR.B).

## 2022-04-30 NOTE — Patient Instructions (Incomplete)

## 2022-05-01 ENCOUNTER — Ambulatory Visit: Payer: Self-pay | Admitting: Nurse Practitioner

## 2022-05-01 DIAGNOSIS — E119 Type 2 diabetes mellitus without complications: Secondary | ICD-10-CM

## 2022-06-26 DIAGNOSIS — Z6828 Body mass index (BMI) 28.0-28.9, adult: Secondary | ICD-10-CM | POA: Diagnosis not present

## 2022-06-26 DIAGNOSIS — I700 Atherosclerosis of aorta: Secondary | ICD-10-CM | POA: Diagnosis not present

## 2022-06-26 DIAGNOSIS — L0212 Furuncle of neck: Secondary | ICD-10-CM | POA: Diagnosis not present

## 2022-06-26 DIAGNOSIS — E1165 Type 2 diabetes mellitus with hyperglycemia: Secondary | ICD-10-CM | POA: Diagnosis not present

## 2022-06-26 DIAGNOSIS — E663 Overweight: Secondary | ICD-10-CM | POA: Diagnosis not present

## 2022-06-26 DIAGNOSIS — I2510 Atherosclerotic heart disease of native coronary artery without angina pectoris: Secondary | ICD-10-CM | POA: Diagnosis not present

## 2022-06-26 DIAGNOSIS — E782 Mixed hyperlipidemia: Secondary | ICD-10-CM | POA: Diagnosis not present

## 2022-08-01 ENCOUNTER — Encounter (HOSPITAL_COMMUNITY): Payer: Self-pay | Admitting: *Deleted

## 2022-08-01 ENCOUNTER — Emergency Department (HOSPITAL_COMMUNITY): Payer: HMO

## 2022-08-01 ENCOUNTER — Emergency Department (HOSPITAL_COMMUNITY)
Admission: EM | Admit: 2022-08-01 | Discharge: 2022-08-01 | Disposition: A | Discharge status: Home / Self Care | Payer: HMO | Attending: Emergency Medicine | Admitting: Emergency Medicine

## 2022-08-01 ENCOUNTER — Other Ambulatory Visit: Payer: Self-pay

## 2022-08-01 DIAGNOSIS — M791 Myalgia, unspecified site: Secondary | ICD-10-CM

## 2022-08-01 DIAGNOSIS — Z7984 Long term (current) use of oral hypoglycemic drugs: Secondary | ICD-10-CM | POA: Diagnosis not present

## 2022-08-01 DIAGNOSIS — K8020 Calculus of gallbladder without cholecystitis without obstruction: Secondary | ICD-10-CM | POA: Insufficient documentation

## 2022-08-01 DIAGNOSIS — I7143 Infrarenal abdominal aortic aneurysm, without rupture: Secondary | ICD-10-CM | POA: Diagnosis not present

## 2022-08-01 DIAGNOSIS — Z79899 Other long term (current) drug therapy: Secondary | ICD-10-CM | POA: Diagnosis not present

## 2022-08-01 DIAGNOSIS — R001 Bradycardia, unspecified: Secondary | ICD-10-CM | POA: Insufficient documentation

## 2022-08-01 DIAGNOSIS — I2510 Atherosclerotic heart disease of native coronary artery without angina pectoris: Secondary | ICD-10-CM | POA: Diagnosis not present

## 2022-08-01 DIAGNOSIS — M7918 Myalgia, other site: Secondary | ICD-10-CM | POA: Diagnosis not present

## 2022-08-01 DIAGNOSIS — Z7982 Long term (current) use of aspirin: Secondary | ICD-10-CM | POA: Diagnosis not present

## 2022-08-01 DIAGNOSIS — R252 Cramp and spasm: Secondary | ICD-10-CM | POA: Diagnosis present

## 2022-08-01 LAB — BLOOD GAS, VENOUS
Acid-base deficit: 0.2 mmol/L (ref 0.0–2.0)
Bicarbonate: 24 mmol/L (ref 20.0–28.0)
Drawn by: 62731
O2 Saturation: 88.4 %
Patient temperature: 36.5
pCO2, Ven: 36 mmHg — ABNORMAL LOW (ref 44–60)
pH, Ven: 7.43 (ref 7.25–7.43)
pO2, Ven: 48 mmHg — ABNORMAL HIGH (ref 32–45)

## 2022-08-01 LAB — CBC WITH DIFFERENTIAL/PLATELET
Abs Immature Granulocytes: 0.08 10*3/uL — ABNORMAL HIGH (ref 0.00–0.07)
Basophils Absolute: 0.1 10*3/uL (ref 0.0–0.1)
Basophils Relative: 1 %
Eosinophils Absolute: 0.2 10*3/uL (ref 0.0–0.5)
Eosinophils Relative: 2 %
HCT: 42.4 % (ref 39.0–52.0)
Hemoglobin: 14.5 g/dL (ref 13.0–17.0)
Immature Granulocytes: 1 %
Lymphocytes Relative: 27 %
Lymphs Abs: 3 10*3/uL (ref 0.7–4.0)
MCH: 30 pg (ref 26.0–34.0)
MCHC: 34.2 g/dL (ref 30.0–36.0)
MCV: 87.8 fL (ref 80.0–100.0)
Monocytes Absolute: 1 10*3/uL (ref 0.1–1.0)
Monocytes Relative: 8 %
Neutro Abs: 7 10*3/uL (ref 1.7–7.7)
Neutrophils Relative %: 61 %
Platelets: 312 10*3/uL (ref 150–400)
RBC: 4.83 MIL/uL (ref 4.22–5.81)
RDW: 14.8 % (ref 11.5–15.5)
WBC: 11.3 10*3/uL — ABNORMAL HIGH (ref 4.0–10.5)
nRBC: 0 % (ref 0.0–0.2)

## 2022-08-01 LAB — COMPREHENSIVE METABOLIC PANEL
ALT: 15 U/L (ref 0–44)
AST: 18 U/L (ref 15–41)
Albumin: 4 g/dL (ref 3.5–5.0)
Alkaline Phosphatase: 57 U/L (ref 38–126)
Anion gap: 8 (ref 5–15)
BUN: 10 mg/dL (ref 8–23)
CO2: 22 mmol/L (ref 22–32)
Calcium: 9 mg/dL (ref 8.9–10.3)
Chloride: 105 mmol/L (ref 98–111)
Creatinine, Ser: 0.87 mg/dL (ref 0.61–1.24)
GFR, Estimated: >60 mL/min (ref >60)
Glucose, Bld: 166 mg/dL — ABNORMAL HIGH (ref 70–99)
Potassium: 4.3 mmol/L (ref 3.5–5.1)
Sodium: 135 mmol/L (ref 135–145)
Total Bilirubin: 0.8 mg/dL (ref 0.3–1.2)
Total Protein: 7 g/dL (ref 6.5–8.1)

## 2022-08-01 LAB — URINALYSIS, W/ REFLEX TO CULTURE (INFECTION SUSPECTED)
Bilirubin Urine: NEGATIVE
Glucose, UA: NEGATIVE mg/dL
Hgb urine dipstick: NEGATIVE
Ketones, ur: NEGATIVE mg/dL
Leukocytes,Ua: NEGATIVE
Nitrite: NEGATIVE
Protein, ur: NEGATIVE mg/dL
Specific Gravity, Urine: 1.005 (ref 1.005–1.030)
pH: 6 (ref 5.0–8.0)

## 2022-08-01 LAB — ETHANOL: Alcohol, Ethyl (B): <10 mg/dL (ref <10)

## 2022-08-01 MED ORDER — IOHEXOL 300 MG/ML  SOLN
100.0000 mL | Freq: Once | INTRAMUSCULAR | Status: AC | PRN
  Administered 2022-08-01: 100 mL via INTRAVENOUS

## 2022-08-01 MED ORDER — ROSUVASTATIN CALCIUM 5 MG PO TABS: 5.0000 mg | ORAL_TABLET | Freq: Every day | ORAL | 0 refills | Status: DC

## 2022-08-01 MED ORDER — SODIUM CHLORIDE 0.9 % IV BOLUS
1000.0000 mL | Freq: Once | INTRAVENOUS | Status: AC
  Administered 2022-08-01: 1000 mL via INTRAVENOUS

## 2022-08-01 NOTE — ED Notes (Signed)
Pt care taken, said that he is cramping at night, making him so sore that it hurts to go to work.

## 2022-08-01 NOTE — Discharge Instructions (Signed)
As discussed, today's evaluation has been generally reassuring.  There is some suspicion that your pain is due to one of your medications.  Please make the switch we discussed and discussed this with your physician.  Return here for concerning changes in your condition.

## 2022-08-01 NOTE — ED Triage Notes (Signed)
Pt states cramping under chest down for past 3-4 weeks, getting worse per pt. SOB the other night while trying to sleep.

## 2022-08-01 NOTE — ED Provider Notes (Signed)
[facility] Provider Note   CSN: [id number] Arrival date & time: 08/01/22  1041     History  Chief Complaint  Patient presents with   cramping     [name] is a 67 y.o. male.  HPI Patient presents with pain in multiple areas, described as crampiness in his extremities, as well as abdominal pain.  He seemingly has been taking his medication as directed which include antihyperglycemic's.  This illness began a few weeks ago, has progressed with now severe discomfort and diminished sensation in his lower extremities.  No fever, no vomiting, no diarrhea, no chest pain.    Home Medications Prior to Admission medications   Medication Sig Start Date End Date Taking? Authorizing Provider  aspirin 325 MG EC tablet Take 325 mg by mouth daily.   Yes [provider]  JANUMET 50-1000 MG tablet Take 1 tablet by mouth 2 (two) times daily. 06/05/22  Yes [provider]  lisinopril (PRINIVIL,ZESTRIL) 2.5 MG tablet Take 2.5 mg by mouth daily.   Yes [provider]  rosuvastatin (CRESTOR) 5 MG tablet Take 1 tablet (5 mg total) by mouth daily. 08/01/22 08/31/22 Yes Carmin Muskrat, MD  finasteride (PROSCAR) 5 MG tablet Take 1 tablet (5 mg total) by mouth daily. Patient not taking: Reported on 08/01/2022 10/14/20   Cleon Gustin, MD      Allergies    Shellfish-derived products    Review of Systems   Review of Systems  All other systems reviewed and are negative.   Physical Exam Updated Vital Signs BP (!) 145/60   Pulse (!) 49   Temp 98.1 F (36.7 C)   Resp 20   Ht 6' (1.829 m)   Wt 90.7 kg   SpO2 99%   BMI 27.12 kg/m  Physical Exam Vitals and nursing note reviewed.  Constitutional:      General: He is not in acute distress.    Appearance: He is well-developed.  HENT:     Head: Normocephalic and atraumatic.  Eyes:     Conjunctiva/sclera: Conjunctivae normal.  Cardiovascular:     Rate and Rhythm: Normal  rate and regular rhythm.  Pulmonary:     Effort: Pulmonary effort is normal. No respiratory distress.     Breath sounds: No stridor.  Abdominal:     General: There is no distension.  Skin:    General: Skin is warm and dry.  Neurological:     Mental Status: He is alert and oriented to person, place, and time.     ED Results / Procedures / Treatments   Labs (all labs ordered are listed, but only abnormal results are displayed) Labs Reviewed  COMPREHENSIVE METABOLIC PANEL - Abnormal; Notable for the following components:      Result Value   Glucose, Bld 166 (*)    All other components within normal limits  CBC WITH DIFFERENTIAL/PLATELET - Abnormal; Notable for the following components:   WBC 11.3 (*)    Abs Immature Granulocytes 0.08 (*)    All other components within normal limits  URINALYSIS, W/ REFLEX TO CULTURE (INFECTION SUSPECTED) - Abnormal; Notable for the following components:   Color, Urine STRAW (*)    Bacteria, UA RARE (*)    All other components within normal limits  BLOOD GAS, VENOUS - Abnormal; Notable for the following components:   pCO2, Ven 36 (*)    pO2, Ven 48 (*)    All other components within normal limits  ETHANOL    EKG EKG Interpretation  Date/Time:  Wednesday August 01 2022 10:54:56 EST Ventricular Rate:  54 PR Interval:  186 QRS Duration: 102 QT Interval:  404 QTC Calculation: 383 R Axis:   81 Text Interpretation: Sinus bradycardia Cannot rule out Anterior infarct , age undetermined Abnormal ECG Confirmed by Carmin Muskrat [phone]) on 08/01/2022 11:14:28 AM  Radiology CT Abdomen Pelvis W Contrast  Result Date: 08/01/2022 CLINICAL DATA:  Cramping abdominal pain for the last 3-4 weeks. EXAM: CT ABDOMEN AND PELVIS WITH CONTRAST TECHNIQUE: Multidetector CT imaging of the abdomen and pelvis was performed using the standard protocol following bolus administration of intravenous contrast. RADIATION DOSE REDUCTION: This exam was performed according to  the departmental dose-optimization program which includes automated exposure control, adjustment of the mA and/or kV according to patient size and/or use of iterative reconstruction technique. CONTRAST:  167m OMNIPAQUE IOHEXOL 300 MG/ML  SOLN COMPARISON:  Noncontrast CT 07/28/2020.  Older CT exams as well. FINDINGS: Lower chest: There is some linear opacity lung bases likely scar or atelectasis. No pleural effusion. Coronary artery calcifications are seen. Hepatobiliary: No space-occupying liver lesion. Patent portal vein. Dependent stones in the nondilated gallbladder. Pancreas: Unremarkable. No pancreatic ductal dilatation or surrounding inflammatory changes. Spleen: Normal in size without focal abnormality. Adrenals/Urinary Tract: Bilateral adrenal nodules are again noted unchanged. 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 Separate adenomas. No enhancing renal mass or collecting system dilatation. The uterus has a normal course and caliber extending down to the bladder. Preserved contours of the urinary bladder. Stomach/Bowel: Large bowel has a normal course and caliber. Scattered colonic stool. Moderate stool burden. No obstruction or dilatation. Normal appendix extends medial to the cecum and inferior in the right lower quadrant. Stomach and small bowel are nondilated. Vascular/Lymphatic: Scattered vascular calcifications. Fusiform infrarenal abdominal aortic aneurysm identified measuring 3.7 3.7 cm. Previously 3.8 cm, not significantly changed. There is also ectasia of the common iliac arteries. No specific abnormal lymph node enlargement identified in the abdomen and pelvis. Reproductive: Prominent prostate with mass effect along the base of the bladder. Other: Small bilateral fat containing inguinal hernias. Small fat containing umbilical hernia. Musculoskeletal: Curvature of the lumbar spine. Scattered degenerative changes of the spine and pelvis.  IMPRESSION: 1. Moderate colonic stool. Normal appendix. No obstruction or free air. 2. Gallstones 3. Fusiform infrarenal abdominal aortic aneurysm measuring up to 3.7 cm, not significantly changed. Recommend follow-up ultrasound every 2 years. This recommendation follows ACR consensus guidelines: White Paper of the ACR Incidental Findings Committee II on Vascular Findings. J Am Coll Radiol 2013; 10:789-794. 4. Prominent prostate with mass effect along the base of the bladder. Please correlate with the patient's PSA. 5. Stable bilateral benign adrenal nodules. 6. Coronary artery calcifications are seen. Electronically Signed   By: AJill SideM.D.   On: 08/01/2022 13:09    Procedures Procedures    Medications Ordered in ED Medications  sodium chloride 0.9 % bolus 1,000 mL (0 mLs Intravenous Stopped 08/01/22 1313)  iohexol (OMNIPAQUE) 300 MG/ML solution 100 mL (100 mLs Intravenous Contrast Given 08/01/22 1229)    ED Course/ Medical Decision Making/ A&P                             Medical Decision Making Patient presents with abdominal pain, paresthesia in his extremities without overt neuro deficiencies.  Some suspicion for neuropathy, other items on differential include  intra-abdominal processes, mass, infection, electrolyte abnormalities, bacteremia, sepsis.  Cardiac 55 sinus bradycardia abnormal Pulse ox 100% room air normal   Amount and/or Complexity of Data Reviewed Labs: ordered. Decision-making details documented in ED Course. Radiology: ordered and independent interpretation performed. Decision-making details documented in ED Course. ECG/medicine tests: ordered and independent interpretation performed. Decision-making details documented in ED Course.  Risk Prescription drug management.   2:38 PM Patient accompanied at bedside by his wife.  We discussed today's findings, CT, labs.  He continues to have diffuse soreness, but no focal pain.  We discussed his medications, in  particular atorvastatin which he only began in the past month, roughly concurrently with his symptoms. He notes that when he does not take his medication symptoms are better, and with this additional detail, reassuring labs, CT as above, no evidence for obstruction, infection, bacteremia, there is suspicion for statin associated myalgia.  Patient will stop atorvastatin, start Crestor, follow-up with primary care.        Final Clinical Impression(s) / ED Diagnoses Final diagnoses:  Myalgia    Rx / DC Orders ED Discharge Orders          Ordered    rosuvastatin (CRESTOR) 5 MG tablet  Daily        08/01/22 1437              Carmin Muskrat, MD 08/01/22 1438

## 2022-08-09 ENCOUNTER — Other Ambulatory Visit (HOSPITAL_COMMUNITY): Payer: Self-pay | Admitting: Family Medicine

## 2022-08-09 DIAGNOSIS — M549 Dorsalgia, unspecified: Secondary | ICD-10-CM | POA: Diagnosis not present

## 2022-08-09 DIAGNOSIS — E782 Mixed hyperlipidemia: Secondary | ICD-10-CM | POA: Diagnosis not present

## 2022-08-09 DIAGNOSIS — Z6829 Body mass index (BMI) 29.0-29.9, adult: Secondary | ICD-10-CM | POA: Diagnosis not present

## 2022-08-09 DIAGNOSIS — E6609 Other obesity due to excess calories: Secondary | ICD-10-CM | POA: Diagnosis not present

## 2022-08-09 DIAGNOSIS — I2510 Atherosclerotic heart disease of native coronary artery without angina pectoris: Secondary | ICD-10-CM

## 2022-08-16 ENCOUNTER — Ambulatory Visit (HOSPITAL_COMMUNITY)
Admission: RE | Admit: 2022-08-16 | Discharge: 2022-08-16 | Disposition: A | Discharge status: Home / Self Care | Payer: HMO | Source: Ambulatory Visit | Attending: Family Medicine | Admitting: Family Medicine

## 2022-08-16 DIAGNOSIS — I2510 Atherosclerotic heart disease of native coronary artery without angina pectoris: Secondary | ICD-10-CM

## 2022-08-16 DIAGNOSIS — I70203 Unspecified atherosclerosis of native arteries of extremities, bilateral legs: Secondary | ICD-10-CM | POA: Diagnosis not present

## 2022-08-16 DIAGNOSIS — M549 Dorsalgia, unspecified: Secondary | ICD-10-CM

## 2022-09-03 DIAGNOSIS — E1140 Type 2 diabetes mellitus with diabetic neuropathy, unspecified: Secondary | ICD-10-CM | POA: Diagnosis not present

## 2022-09-03 DIAGNOSIS — E663 Overweight: Secondary | ICD-10-CM | POA: Diagnosis not present

## 2022-09-03 DIAGNOSIS — I2510 Atherosclerotic heart disease of native coronary artery without angina pectoris: Secondary | ICD-10-CM | POA: Diagnosis not present

## 2022-09-03 DIAGNOSIS — E782 Mixed hyperlipidemia: Secondary | ICD-10-CM | POA: Diagnosis not present

## 2022-09-03 DIAGNOSIS — Z6829 Body mass index (BMI) 29.0-29.9, adult: Secondary | ICD-10-CM | POA: Diagnosis not present

## 2022-09-03 DIAGNOSIS — E1165 Type 2 diabetes mellitus with hyperglycemia: Secondary | ICD-10-CM | POA: Diagnosis not present

## 2022-09-03 DIAGNOSIS — I739 Peripheral vascular disease, unspecified: Secondary | ICD-10-CM | POA: Diagnosis not present

## 2022-09-28 ENCOUNTER — Ambulatory Visit (INDEPENDENT_AMBULATORY_CARE_PROVIDER_SITE_OTHER): Payer: HMO | Admitting: Urology

## 2022-09-28 VITALS — BP 134/71 | HR 54

## 2022-09-28 DIAGNOSIS — R310 Gross hematuria: Secondary | ICD-10-CM

## 2022-09-28 DIAGNOSIS — N5201 Erectile dysfunction due to arterial insufficiency: Secondary | ICD-10-CM

## 2022-09-28 LAB — URINALYSIS, ROUTINE W REFLEX MICROSCOPIC
Bilirubin, UA: NEGATIVE
Ketones, UA: NEGATIVE
Leukocytes,UA: NEGATIVE
Nitrite, UA: NEGATIVE
RBC, UA: NEGATIVE
Specific Gravity, UA: 1.02 (ref 1.005–1.030)
Urobilinogen, Ur: 0.2 mg/dL (ref 0.2–1.0)
pH, UA: 5.5 (ref 5.0–7.5)

## 2022-09-28 LAB — MICROSCOPIC EXAMINATION
Bacteria, UA: NONE SEEN
RBC, Urine: NONE SEEN /hpf (ref 0–2)

## 2022-09-28 MED ORDER — CIPROFLOXACIN HCL 500 MG PO TABS
500.0000 mg | ORAL_TABLET | Freq: Once | ORAL | Status: AC
  Administered 2022-09-28: 500 mg via ORAL

## 2022-09-28 MED ORDER — TADALAFIL 20 MG PO TABS: 20.0000 mg | ORAL_TABLET | Freq: Every day | ORAL | 5 refills | Status: DC | PRN

## 2022-09-28 NOTE — Patient Instructions (Signed)
Erectile Dysfunction ?Erectile dysfunction (ED) is the inability to get or keep an erection in order to have sexual intercourse. ED is considered a symptom of an underlying disorder and is not considered a disease. ED may include: ?Inability to get an erection. ?Lack of enough hardness of the erection to allow penetration. ?Loss of erection before sex is finished. ?What are the causes? ?This condition may be caused by: ?Physical causes, such as: ?Artery problems. This may include heart disease, high blood pressure, atherosclerosis, and diabetes. ?Hormonal problems, such as low testosterone. ?Obesity. ?Nerve problems. This may include back or pelvic injuries, multiple sclerosis, Parkinson's disease, spinal cord injury, and stroke. ?Certain medicines, such as: ?Pain relievers. ?Antidepressants. ?Blood pressure medicines and water pills (diuretics). ?Cancer medicines. ?Antihistamines. ?Muscle relaxants. ?Lifestyle factors, such as: ?Use of drugs such as marijuana, cocaine, or opioids. ?Excessive use of alcohol. ?Smoking. ?Lack of physical activity or exercise. ?Psychological causes, such as: ?Anxiety or stress. ?Sadness or depression. ?Exhaustion. ?Fear about sexual performance. ?Guilt. ?What are the signs or symptoms? ?Symptoms of this condition include: ?Inability to get an erection. ?Lack of enough hardness of the erection to allow penetration. ?Loss of the erection before sex is finished. ?Sometimes having normal erections, but with frequent unsatisfactory episodes. ?Low sexual satisfaction in either partner due to erection problems. ?A curved penis occurring with erection. The curve may cause pain, or the penis may be too curved to allow for intercourse. ?Never having nighttime or morning erections. ?How is this diagnosed? ?This condition is often diagnosed by: ?Performing a physical exam to find other diseases or specific problems with the penis. ?Asking you detailed questions about the problem. ?Doing tests,  such as: ?Blood tests to check for diabetes mellitus or high cholesterol, or to measure hormone levels. ?Other tests to check for underlying health conditions. ?An ultrasound exam to check for scarring. ?A test to check blood flow to the penis. ?Doing a sleep study at home to measure nighttime erections. ?How is this treated? ?This condition may be treated by: ?Medicines, such as: ?Medicine taken by mouth to help you achieve an erection (oral medicine). ?Hormone replacement therapy to replace low testosterone levels. ?Medicine that is injected into the penis. Your health care provider may instruct you how to give yourself these injections at home. ?Medicine that is delivered with a short applicator tube. The tube is inserted into the opening at the tip of the penis, which is the opening of the urethra. A tiny pellet of medicine is put in the urethra. The pellet dissolves and enhances erectile function. This is also called MUSE (medicated urethral system for erections) therapy. ?Vacuum pump. This is a pump with a ring on it. The pump and ring are placed on the penis and used to create pressure that helps the penis become erect. ?Penile implant surgery. In this procedure, you may receive: ?An inflatable implant. This consists of cylinders, a pump, and a reservoir. The cylinders can be inflated with a fluid that helps to create an erection, and they can be deflated after intercourse. ?A semi-rigid implant. This consists of two silicone rubber rods. The rods provide some rigidity. They are also flexible, so the penis can both curve downward in its normal position and become straight for sexual intercourse. ?Blood vessel surgery to improve blood flow to the penis. During this procedure, a blood vessel from a different part of the body is placed into the penis to allow blood to flow around (bypass) damaged or blocked blood vessels. ?Lifestyle changes,   such as exercising more, losing weight, and quitting smoking. ?Follow  these instructions at home: ?Medicines ? ?Take over-the-counter and prescription medicines only as told by your health care provider. Do not increase the dosage without first discussing it with your health care provider. ?If you are using self-injections, do injections as directed by your health care provider. Make sure you avoid any veins that are on the surface of the penis. After giving an injection, apply pressure to the injection site for 5 minutes. ?Talk to your health care provider about how to prevent headaches while taking ED medicines. These medicines may cause a sudden headache due to the increase in blood flow in your body. ?General instructions ?Exercise regularly, as directed by your health care provider. Work with your health care provider to lose weight, if needed. ?Do not use any products that contain nicotine or tobacco. These products include cigarettes, chewing tobacco, and vaping devices, such as e-cigarettes. If you need help quitting, ask your health care provider. ?Before using a vacuum pump, read the instructions that come with the pump and discuss any questions with your health care provider. ?Keep all follow-up visits. This is important. ?Contact a health care provider if: ?You feel nauseous. ?You are vomiting. ?You get sudden headaches while taking ED medicines. ?You have any concerns about your sexual health. ?Get help right away if: ?You are taking oral or injectable medicines and you have an erection that lasts longer than 4 hours. If your health care provider is unavailable, go to the nearest emergency room for evaluation. An erection that lasts much longer than 4 hours can result in permanent damage to your penis. ?You have severe pain in your groin or abdomen. ?You develop redness or severe swelling of your penis. ?You have redness spreading at your groin or lower abdomen. ?You are unable to urinate. ?You experience chest pain or a rapid heartbeat (palpitations) after taking oral  medicines. ?These symptoms may represent a serious problem that is an emergency. Do not wait to see if the symptoms will go away. Get medical help right away. Call your local emergency services (911 in the [place] Do not drive yourself to the hospital. ?Summary ?Erectile dysfunction (ED) is the inability to get or keep an erection during sexual intercourse. ?This condition is diagnosed based on a physical exam, your symptoms, and tests to determine the cause. Treatment varies depending on the cause and may include medicines, hormone therapy, surgery, or a vacuum pump. ?You may need follow-up visits to make sure that you are using your medicines or devices correctly. ?Get help right away if you are taking or injecting medicines and you have an erection that lasts longer than 4 hours. ?This information is not intended to replace advice given to you by your health care provider. Make sure you discuss any questions you have with your health care provider. ?Document Revised: 08/10/2020 Document Reviewed: 08/10/2020 ?Elsevier Patient Education ? 2023 [employer] ? ?

## 2022-09-28 NOTE — Progress Notes (Unsigned)
09/28/2022 9:29 AM   [name] [birth year] [id number]  Referring provider: Assunta Found, MD [address] [place],  [place] [address]  No chief complaint on file.   HPI:    PMH: Past Medical History:  Diagnosis Date   CAD (coronary artery disease)    cardiac cath 2010       Diabetes mellitus    Dilatation of aorta (HCC)    Abdominal aortic dopplers on 04/17/2012 showed mild distal abdominal aortic dilatation measuring 3.0x3.2cm   Hyperlipidemia    Hypertension    Obesity     Surgical History: Past Surgical History:  Procedure Laterality Date   CARDIAC CATHETERIZATION     11/16/2008  2.5 x 15 XIENCE V drug-eluting stent   KNEE SURGERY     Right    Home Medications:  Allergies as of 09/28/2022       Reactions   Shellfish-derived Products         Medication List        Accurate as of Sep 28, 2022  9:29 AM. If you have any questions, ask your nurse or doctor.          aspirin EC 325 MG tablet Take 325 mg by mouth daily.   finasteride 5 MG tablet Commonly known as: PROSCAR Take 1 tablet (5 mg total) by mouth daily.   Janumet 50-1000 MG tablet Generic drug: sitaGLIPtin-metformin Take 1 tablet by mouth 2 (two) times daily.   lisinopril 2.5 MG tablet Commonly known as: ZESTRIL Take 2.5 mg by mouth daily.   rosuvastatin 5 MG tablet Commonly known as: Crestor Take 1 tablet (5 mg total) by mouth daily.        Allergies:  Allergies  Allergen Reactions   Shellfish-Derived Products     Family History: Family History  Problem Relation Age of Onset   Diabetes Mother    Diabetes Paternal Grandmother    Diabetes Brother     Social History:  reports that he has been smoking cigarettes. He has a 80.00 pack-year smoking history. He has never used smokeless tobacco. He reports that he does not currently use alcohol after a past usage of about 1.0 standard drink of alcohol per week. He reports that he does not use drugs.  ROS: All  other review of systems were reviewed and are negative except what is noted above in HPI  Physical Exam: BP 134/71   Pulse (!) 54   Constitutional:  Alert and oriented, No acute distress. HEENT: [place] AT, moist mucus membranes.  Trachea midline, no masses. Cardiovascular: No clubbing, cyanosis, or edema. Respiratory: Normal respiratory effort, no increased work of breathing. GI: Abdomen is soft, nontender, nondistended, no abdominal masses GU: No CVA tenderness.  Lymph: No cervical or inguinal lymphadenopathy. Skin: No rashes, bruises or suspicious lesions. Neurologic: Grossly intact, no focal deficits, moving all 4 extremities. Psychiatric: Normal mood and affect.  Laboratory Data: Lab Results  Component Value Date   WBC 11.3 (H) 08/01/2022   HGB 14.5 08/01/2022   HCT 42.4 08/01/2022   MCV 87.8 08/01/2022   PLT 312 08/01/2022    Lab Results  Component Value Date   CREATININE 0.87 08/01/2022    No results found for: "PSA"  No results found for: "TESTOSTERONE"  No results found for: "HGBA1C"  Urinalysis    Component Value Date/Time   COLORURINE STRAW (A) 08/01/2022 1130   APPEARANCEUR CLEAR 08/01/2022 1130   APPEARANCEUR Clear 10/14/2020 0932   LABSPEC 1.005 08/01/2022 1130  PHURINE 6.0 08/01/2022 1130   GLUCOSEU NEGATIVE 08/01/2022 1130   HGBUR NEGATIVE 08/01/2022 1130   BILIRUBINUR NEGATIVE 08/01/2022 1130   BILIRUBINUR Negative 10/14/2020 0932   KETONESUR NEGATIVE 08/01/2022 1130   PROTEINUR NEGATIVE 08/01/2022 1130   UROBILINOGEN 0.2 12/14/2010 2204   NITRITE NEGATIVE 08/01/2022 1130   LEUKOCYTESUR NEGATIVE 08/01/2022 1130    Lab Results  Component Value Date   LABMICR See below: 10/14/2020   WBCUA None seen 10/14/2020   LABEPIT None seen 10/14/2020   MUCUS Present 10/14/2020   BACTERIA RARE (A) 08/01/2022    Pertinent Imaging: *** No results found for this or any previous visit.  No results found for this or any previous visit.  No results  found for this or any previous visit.  No results found for this or any previous visit.  No results found for this or any previous visit.  No valid procedures specified. No results found for this or any previous visit.  Results for orders placed during the hospital encounter of 07/28/20  CT Renal Stone Study  Narrative CLINICAL DATA:  Hematuria  EXAM: CT ABDOMEN AND PELVIS WITHOUT CONTRAST  TECHNIQUE: Multidetector CT imaging of the abdomen and pelvis was performed following the standard protocol without IV contrast.  COMPARISON:  02/14/2020  FINDINGS: Lower chest: The visualized lung bases are clear. At least moderate right coronary artery calcification. Global cardiac size within normal limits.  Hepatobiliary: Cholelithiasis without pericholecystic inflammatory change. Liver unremarkable. No intra or extrahepatic biliary ductal dilation.  Pancreas: Unremarkable  Spleen: Unremarkable  Adrenals/Urinary Tract: 2 cm adrenal adenoma within the lateral limb and 12 mm adrenal adenoma within the body of the left adrenal gland are again identified and are unchanged. Right adrenal gland is unremarkable. Kidneys are normal in size and position and are unremarkable. Bladder is unremarkable.  Stomach/Bowel: Stomach is within normal limits. Appendix appears normal. No evidence of bowel wall thickening, distention, or inflammatory changes. No free intraperitoneal gas or fluid  Vascular/Lymphatic: 3.8 cm fusiform infrarenal abdominal aortic aneurysm, stable since prior examination. Moderate superimposed aortoiliac atherosclerotic calcification.  No pathologic adenopathy within the abdomen and pelvis.  Reproductive: Mild prostatic enlargement. Seminal vesicles are unremarkable.  Other: Small fat containing left inguinal hernia. Small fat containing umbilical hernia.  Musculoskeletal: Degenerative changes are seen within the lumbar spine. No lytic or blastic bone lesions  are seen.  IMPRESSION: No acute intra-abdominal pathology. No definite radiographic explanation for the patient's reported symptoms.  Cholelithiasis  Multiple left adrenal adenomas, unchanged.  Stable 3.8 cm infrarenal abdominal aortic aneurysm. Recommend follow-up ultrasound every 2 years. This recommendation follows ACR consensus guidelines: White Paper of the ACR Incidental Findings Committee II on Vascular Findings. J Am Coll Radiol 2013; 10:789-794.  Aortic aneurysm NOS (ICD10-I71.9).   Electronically Signed By: Helyn Numbers MD On: 07/29/2020 01:39   Assessment & Plan:    1. Hematuria, gross *** - Urinalysis, Routine w reflex microscopic - ciprofloxacin (CIPRO) tablet 500 mg  2. Erectile dysfunction due to arterial insufficiency ***   No follow-ups on file.  Wilkie Aye, MD  [facility] [place]    Cystoscopy Procedure Note  Patient identification was confirmed, informed consent was obtained, and patient was prepped using Betadine solution.  Lidocaine jelly was administered per urethral meatus.     Pre-Procedure: - Inspection reveals a normal caliber ureteral meatus.  Procedure: The flexible cystoscope was introduced without difficulty - No urethral strictures/lesions are present. - {Blank multiple:19197::"Enlarged","Surgically absent","Normal"} prostate *** - {Blank multiple:19197::"Normal","Elevated","Tight"} bladder  neck - Bilateral ureteral orifices identified - Bladder mucosa  reveals no ulcers, tumors, or lesions - No bladder stones - No trabeculation  Retroflexion shows ***   Post-Procedure: - Patient tolerated the procedure well  Assessment/ Plan:   No follow-ups on file.  Wilkie Aye, MD

## 2022-11-22 ENCOUNTER — Encounter: Payer: Self-pay | Admitting: Vascular Surgery

## 2022-11-22 ENCOUNTER — Ambulatory Visit (INDEPENDENT_AMBULATORY_CARE_PROVIDER_SITE_OTHER): Payer: HMO | Admitting: Vascular Surgery

## 2022-11-22 VITALS — BP 168/81 | HR 52 | Temp 98.1°F | Resp 20 | Ht 72.0 in | Wt 218.0 lb

## 2022-11-22 DIAGNOSIS — I70219 Atherosclerosis of native arteries of extremities with intermittent claudication, unspecified extremity: Secondary | ICD-10-CM

## 2022-11-22 DIAGNOSIS — I7143 Infrarenal abdominal aortic aneurysm, without rupture: Secondary | ICD-10-CM | POA: Diagnosis not present

## 2022-11-22 NOTE — Progress Notes (Signed)
ASSESSMENT & PLAN   PERIPHERAL ARTERIAL DISEASE: This patient has stable bilateral calf claudication.  We have discussed the importance of tobacco cessation.  I encouraged him to stay as active as possible.  I think a lot of his pain in his legs is from his degenerative disc disease of his back.  When he comes in for a follow-up duplex of his abdominal aortic aneurysm in 2 years we will check ABIs.  He knows to call sooner if he has problems.  ABDOMINAL AORTIC ANEURYSM: This patient has a small 3.7 cm infrarenal abdominal aortic aneurysm.  I explained that we would not consider elective repair and a normal risk patient unless it reaches 5.5 cm in maximum diameter.  We have discussed the importance of tobacco cessation.  He understands that continued tobacco use does increase his risk of aneurysm expansion and rupture.  We also discussed the importance of maintaining good blood pressure control.  I have ordered a follow-up duplex scan in 2 years and we will see him back at that time.  I explained that I will be retiring so he will be seen on the PA schedule at that time.   REASON FOR CONSULT:    Peripheral arterial disease and abdominal aortic aneurysm.  The patient is referred by Dr. Assunta Found.   HPI:   [name] is a 67 y.o. male who is referred for evaluation of peripheral arterial disease and a small abdominal aortic aneurysm.  I have reviewed the records from the referring office.  He was seen on 09/04/2022.  He had presented with back pain.  In addition he has a history of hypertension, diabetes, hyperlipidemia, and coronary artery disease.  He is also a smoker.  He had evidence of peripheral arterial disease and was sent for vascular consultation.  On my history, the patient does describe some calf claudication bilaterally.  However most of his symptoms are related to his degenerative disc disease of his back.  He has significant back pain and has some numbness in both legs.  He denies  any history of rest pain or nonhealing ulcers.  He does have a small abdominal aortic aneurysm and was aware of that.  He had no significant abdominal pain.  He is on aspirin and a statin.  He smokes 2 packs/day and has been smoking for 40 years.  Past Medical History:  Diagnosis Date   CAD (coronary artery disease)    cardiac cath 2010       Diabetes mellitus    Dilatation of aorta (HCC)    Abdominal aortic dopplers on 04/17/2012 showed mild distal abdominal aortic dilatation measuring 3.0x3.2cm   Hyperlipidemia    Hypertension    Obesity     Family History  Problem Relation Age of Onset   Diabetes Mother    Diabetes Paternal Grandmother    Diabetes Brother     SOCIAL HISTORY: Social History   Tobacco Use   Smoking status: Every Day    Packs/day: 2.00    Years: 40.00    Additional pack years: 0.00    Total pack years: 80.00    Types: Cigarettes   Smokeless tobacco: Never  Substance Use Topics   Alcohol use: Not Currently    Alcohol/week: 1.0 standard drink of alcohol    Types: 1 Standard drinks or equivalent per week    Allergies  Allergen Reactions   Shellfish-Derived Products     Current Outpatient Medications  Medication Sig Dispense Refill  aspirin 325 MG EC tablet Take 325 mg by mouth daily.     JANUMET 50-1000 MG tablet Take 1 tablet by mouth 2 (two) times daily.     rosuvastatin (CRESTOR) 20 MG tablet Take 20 mg by mouth daily.     tadalafil (CIALIS) 20 MG tablet Take 1 tablet (20 mg total) by mouth daily as needed. 30 tablet 5   No current facility-administered medications for this visit.    REVIEW OF SYSTEMS:  [X]  denotes positive finding, [ ]  denotes negative finding Cardiac  Comments:  Chest pain or chest pressure:    Shortness of breath upon exertion:    Short of breath when lying flat:    Irregular heart rhythm:        Vascular    Pain in calf, thigh, or hip brought on by ambulation: x   Pain in feet at night that wakes you up from  your sleep:     Blood clot in your veins:    Leg swelling:         Pulmonary    Oxygen at home:    Productive cough:     Wheezing:         Neurologic    Sudden weakness in arms or legs:     Sudden numbness in arms or legs:     Sudden onset of difficulty speaking or slurred speech:    Temporary loss of vision in one eye:     Problems with dizziness:         Gastrointestinal    Blood in stool:     Vomited blood:         Genitourinary    Burning when urinating:     Blood in urine:        Psychiatric    Major depression:         Hematologic    Bleeding problems:    Problems with blood clotting too easily:        Skin    Rashes or ulcers:        Constitutional    Fever or chills:    -  PHYSICAL EXAM:   Vitals:   11/22/22 1539  BP: (!) 168/81  Pulse: (!) 52  Resp: 20  Temp: 98.1 F (36.7 C)  SpO2: 94%  Weight: 218 lb (98.9 kg)  Height: 6' (1.829 m)   Body mass index is 29.57 kg/m. GENERAL: The patient is a well-nourished male, in no acute distress. The vital signs are documented above. CARDIAC: There is a regular rate and rhythm.  VASCULAR: I do not detect carotid bruits. On the right side he has a palpable femoral and popliteal pulse.  He has a brisk but monophasic posterior tibial and dorsalis pedis signal with a Doppler. On the left side he has a palpable femoral, popliteal, and posterior tibial pulse.  He has a monophasic dorsalis pedis signal with Doppler. He has no significant lower extremity swelling. PULMONARY: There is good air exchange bilaterally without wheezing or rales. ABDOMEN: Soft and non-tender with normal pitched bowel sounds.  His aneurysm is nontender. MUSCULOSKELETAL: There are no major deformities. NEUROLOGIC: No focal weakness or paresthesias are detected. SKIN: There are no ulcers or rashes noted. PSYCHIATRIC: The patient has a normal affect.  DATA:    ARTERIAL DOPPLER STUDY: This patient had a vascular ultrasound done elsewhere  on 08/16/2022.  This showed an ABI of 84% on the right and 100% on the left.  CT ABDOMEN PELVIS:  This patient had a CT abdomen pelvis on 08/01/2022 because of cramping abdominal pain.  The patient was noted to have a fusiform infrarenal abdominal aortic aneurysm measuring 3.7 cm in maximum diameter.  Waverly Ferrari [facility]

## 2022-11-28 ENCOUNTER — Encounter: Payer: Self-pay | Admitting: Internal Medicine

## 2022-11-28 ENCOUNTER — Ambulatory Visit: Payer: HMO | Attending: Internal Medicine | Admitting: Internal Medicine

## 2022-11-28 VITALS — BP 124/62 | HR 48 | Ht 72.0 in | Wt 221.0 lb

## 2022-11-28 DIAGNOSIS — I2510 Atherosclerotic heart disease of native coronary artery without angina pectoris: Secondary | ICD-10-CM | POA: Diagnosis not present

## 2022-11-28 DIAGNOSIS — I25119 Atherosclerotic heart disease of native coronary artery with unspecified angina pectoris: Secondary | ICD-10-CM | POA: Diagnosis not present

## 2022-11-28 DIAGNOSIS — I7143 Infrarenal abdominal aortic aneurysm, without rupture: Secondary | ICD-10-CM | POA: Diagnosis not present

## 2022-11-28 DIAGNOSIS — R001 Bradycardia, unspecified: Secondary | ICD-10-CM | POA: Diagnosis not present

## 2022-11-28 DIAGNOSIS — I739 Peripheral vascular disease, unspecified: Secondary | ICD-10-CM

## 2022-11-28 DIAGNOSIS — I714 Abdominal aortic aneurysm, without rupture, unspecified: Secondary | ICD-10-CM | POA: Insufficient documentation

## 2022-11-28 MED ORDER — AMLODIPINE BESYLATE 2.5 MG PO TABS: 2.5000 mg | ORAL_TABLET | Freq: Every day | ORAL | 2 refills | Status: DC

## 2022-11-28 MED ORDER — ASPIRIN 81 MG PO TBEC: 81.0000 mg | DELAYED_RELEASE_TABLET | Freq: Every day | ORAL | 12 refills | Status: AC

## 2022-11-28 NOTE — Progress Notes (Signed)
Cardiology Office Note  Date: 11/28/2022   ID: [name], DOB 11-Aug-1955, MRN [id number]  PCP:  Assunta Found, MD  Cardiologist:  None Electrophysiologist:  None   Reason for Office Visit: Chest pain evaluation   History of Present Illness: [name] is a 67 y.o. 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  Patient is currently unemployed, has no job and whenever bills pile up, he noticed he was getting stressed out and started to get chest pains. Sublingual nitroglycerin seemed to relieve the pain.  He also takes tadalafil as needed for erectile dysfunction. But he is physically active at baseline, can perform his household chores, do his yard work with no symptoms of angina.  He has soreness in his calf muscles with walking (claudication) and ongoing for the last 1 year or so.  He also has degenerative back disease that started 1 year ago and causing back pain and leg pains.  No dizziness, lightheadedness, syncope or severe fatigue.  Currently smokes 2 packs/day, previously tried nicotine patches but had blisters on his arm.  Past Medical History:  Diagnosis Date   CAD (coronary artery disease)    cardiac cath 2010       Diabetes mellitus    Dilatation of aorta (HCC)    Abdominal aortic dopplers on 04/17/2012 showed mild distal abdominal aortic dilatation measuring 3.0x3.2cm   Hyperlipidemia    Hypertension    Obesity     Past Surgical History:  Procedure Laterality Date   CARDIAC CATHETERIZATION     11/16/2008  2.5 x 15 XIENCE V drug-eluting stent   KNEE SURGERY     Right    Current Outpatient Medications  Medication Sig Dispense Refill   aspirin 325 MG EC tablet Take 325 mg by mouth daily.     JANUMET 50-1000 MG tablet Take 1 tablet by mouth 2 (two) times daily.     magnesium oxide (MAG-OX) 400  (240 Mg) MG tablet Take 400 mg by mouth daily.     Omega-3 Fatty Acids (FISH OIL) 1000 MG CAPS Take 1,000 mg by mouth daily.     rosuvastatin (CRESTOR) 20 MG tablet Take 20 mg by mouth daily.     tadalafil (CIALIS) 20 MG tablet Take 1 tablet (20 mg total) by mouth daily as needed. 30 tablet 5   No current facility-administered medications for this visit.   Allergies:  Shellfish-derived products   Social History: The patient  reports that he has been smoking cigarettes. He has a 80.00 pack-year smoking history. He has never used smokeless tobacco. He reports that he does not currently use alcohol after a past usage of about 1.0 standard drink of alcohol per week. He reports that he does not use drugs.   Family History: The patient's family history includes COPD in his paternal grandfather; Diabetes in his brother, mother, and paternal grandmother; Lung cancer in his maternal grandfather.   ROS:  Please see the history of present illness. Otherwise, complete review of systems is positive for none.  All other systems are reviewed and negative.   Physical Exam: VS:  BP 124/62   Pulse (!) 48   Ht 6' (1.829 m)   Wt 221 lb (100.2 kg)   SpO2 96%   BMI 29.97 kg/m , BMI Body mass  index is 29.97 kg/m.  Wt Readings from Last 3 Encounters:  11/28/22 221 lb (100.2 kg)  11/22/22 218 lb (98.9 kg)  08/01/22 200 lb (90.7 kg)    General: Patient appears comfortable at rest. HEENT: Conjunctiva and lids normal, oropharynx clear with moist mucosa. Neck: Supple, no elevated JVP or carotid bruits, no thyromegaly. Lungs: Clear to auscultation, nonlabored breathing at rest. Cardiac: Regular rate and rhythm, no S3 or significant systolic murmur, no pericardial rub. Abdomen: Soft, nontender, no hepatomegaly, bowel sounds present, no guarding or rebound. Extremities: No pitting edema, distal pulses 2+. Skin: Warm and dry. Musculoskeletal: No kyphosis. Neuropsychiatric: Alert and oriented x3, affect grossly  appropriate.  Recent Labwork: 08/01/2022: ALT 15; AST 18; BUN 10; Creatinine, Ser 0.87; Hemoglobin 14.5; Platelets 312; Potassium 4.3; Sodium 135  No results found for: "CHOL", "TRIG", "HDL", "CHOLHDL", "VLDL", "LDLCALC", "LDLDIRECT"   Assessment and Plan:  # CAD manifested by ACS in 2010 s/p LCx PCI -Ongoing chest pains at rest from stress and no chest pain with exertion.  No angina. SL NTG relieves the rest chest pain from stress. Cannot start Imdur due to concurrent use of tadalafil and cannot start beta-blockers due to severe sinus bradycardia, asymptomatic.  Start amlodipine 2.5 mg once daily. I will defer stress testing until he starts to have chest pain with exertion. Discontinue aspirin 325 mg and start aspirin 81 mg once daily. Continue rosuvastatin 20 mg nightly.  He had requested for chest pain provided, discussed not to use nitroglycerin and tadalafil together.  Obtain 2D echocardiogram.  # PAD # Infrarenal AAA -CT imaging from 3/24 showed fusiform infrarenal abdominal aortic aneurysm measuring 3.7 centimeter in maximal diameter. -ABI in 3/24 showed 0.84 on the right and 1.00 on the left lower extremity. Continues to have stable claudication.  Follows up with vascular surgery. Discontinue aspirin 325 mg and start aspirin 81 mg once daily. Continue high intensity statin, rosuvastatin 20 mg nightly.  Strongly encouraged smoking cessation.  # Severe sinus bradycardia, asymptomatic -EKG showed NSR, HR 48 bpm.  No lightheadedness, dizziness, syncope or severe fatigue.  # HLD, not at goal -Continue rosuvastatin 20 mg nightly, goal LDL less than 55 (PAD and CAD).  Will repeat lipid panel in the next clinic visit and add Zetia 10 mg once daily if needed.  # Nicotine abuse -Patient smokes 2 packs/day. Had blisters with nicotine patches.  Instructed him to cut down smoking cigarettes. Smoking cessation instruction/counseling given:  counseled patient on the dangers of tobacco use, advised  patient to stop smoking, and reviewed strategies to maximize success   I have spent a total of 45 minutes with patient reviewing chart, EKGs, labs and examining patient as well as establishing an assessment and plan that was discussed with the patient.  > 50% of time was spent in direct patient care.    Medication Adjustments/Labs and Tests Ordered: Current medicines are reviewed at length with the patient today.  Concerns regarding medicines are outlined above.   Tests Ordered: Orders Placed This Encounter  Procedures   EKG 12-Lead    Medication Changes: No orders of the defined types were placed in this encounter.   Disposition:  Follow up  3 months  Signed, Sama Arauz Verne Spurr, MD, 11/28/2022 9:16 AM    [facility] at [facility] [address] [place], [place] [address]

## 2022-11-28 NOTE — Patient Instructions (Addendum)
Medication Instructions:  Your physician has recommended you make the following change in your medication:  Stop taking Aspirin 325 Start taking Aspirin 81 mg daily Start Amlodipine 2.5 mg daily Continue all other medications as prescribed  Labwork: None  Testing/Procedures: Your physician has requested that you have an echocardiogram. Echocardiography is a painless test that uses sound waves to create images of your heart. It provides your doctor with information about the size and shape of your heart and how well your heart's chambers and valves are working. This procedure takes approximately one hour. There are no restrictions for this procedure. Please do NOT wear cologne, perfume, aftershave, or lotions (deodorant is allowed). Please arrive 15 minutes prior to your appointment time.   Follow-Up: Your physician recommends that you schedule a follow-up appointment in: 3 months  Any Other Special Instructions Will Be Listed Below (If Applicable).  If you need a refill on your cardiac medications before your next appointment, please call your pharmacy.

## 2022-12-09 ENCOUNTER — Emergency Department (HOSPITAL_COMMUNITY)
Admission: EM | Admit: 2022-12-09 | Discharge: 2022-12-10 | Disposition: A | Discharge status: Home / Self Care | Payer: HMO | Attending: Emergency Medicine | Admitting: Emergency Medicine

## 2022-12-09 DIAGNOSIS — F17210 Nicotine dependence, cigarettes, uncomplicated: Secondary | ICD-10-CM | POA: Diagnosis not present

## 2022-12-09 DIAGNOSIS — R21 Rash and other nonspecific skin eruption: Secondary | ICD-10-CM | POA: Diagnosis present

## 2022-12-09 DIAGNOSIS — B356 Tinea cruris: Secondary | ICD-10-CM | POA: Diagnosis not present

## 2022-12-09 DIAGNOSIS — I10 Essential (primary) hypertension: Secondary | ICD-10-CM | POA: Insufficient documentation

## 2022-12-09 DIAGNOSIS — R1031 Right lower quadrant pain: Secondary | ICD-10-CM | POA: Insufficient documentation

## 2022-12-09 DIAGNOSIS — R39198 Other difficulties with micturition: Secondary | ICD-10-CM | POA: Insufficient documentation

## 2022-12-09 DIAGNOSIS — I2510 Atherosclerotic heart disease of native coronary artery without angina pectoris: Secondary | ICD-10-CM | POA: Insufficient documentation

## 2022-12-09 DIAGNOSIS — E119 Type 2 diabetes mellitus without complications: Secondary | ICD-10-CM | POA: Diagnosis not present

## 2022-12-09 NOTE — ED Triage Notes (Addendum)
Pt to ed c/o of groin rash that is progressively getting worse. Pt states that they are circular in nature with white ring on right side of groin and "just red" on left side of groin. Pt states rash is starting to travel down bilateral legs. Pt states has urinary burning and discomfort during urination along with LLQ pain. Pt states that this started roughly 2 weeks ago. Pt has not tried otc medications to treat rash but does use "wipes" to help keep groin clean. Denies n/v/d.

## 2022-12-10 ENCOUNTER — Other Ambulatory Visit: Payer: Self-pay

## 2022-12-10 ENCOUNTER — Encounter (HOSPITAL_COMMUNITY): Payer: Self-pay | Admitting: Emergency Medicine

## 2022-12-10 LAB — URINALYSIS, ROUTINE W REFLEX MICROSCOPIC
Bacteria, UA: NONE SEEN
Bilirubin Urine: NEGATIVE
Glucose, UA: >=500 mg/dL — AB
Hgb urine dipstick: NEGATIVE
Ketones, ur: NEGATIVE mg/dL
Leukocytes,Ua: NEGATIVE
Nitrite: NEGATIVE
Protein, ur: NEGATIVE mg/dL
Specific Gravity, Urine: 1.006 (ref 1.005–1.030)
pH: 6 (ref 5.0–8.0)

## 2022-12-10 MED ORDER — CLOTRIMAZOLE 1 % EX LOTN: TOPICAL_LOTION | CUTANEOUS | 3 refills | Status: DC

## 2022-12-10 NOTE — Discharge Instructions (Signed)
You were evaluated in the Emergency Department and after careful evaluation, we did not find any emergent condition requiring admission or further testing in the hospital.  Your exam/testing today is overall reassuring.  Take the clotrimazole solution twice daily as prescribed, follow-up with your regular doctor and urologist.  Please return to the Emergency Department if you experience any worsening of your condition.   Thank you for allowing [place] to be a part of your care.

## 2022-12-10 NOTE — ED Provider Notes (Signed)
[facility]-EMERGENCY DEPT [facility] Emergency Department Provider Note MRN:  [id number]  Arrival date & time: 12/10/22     Chief Complaint   Rash (groin)   History of Present Illness   [name] is a 67 y.o. year-old male with a history of CAD, diabetes presenting to the ED with chief complaint of rash.  Itchy and uncomfortable rash to bilateral groin regions.  Present for 2 weeks, not going away.  Also burning with urination and left lower quadrant pain which has been going on for several months, doctors have done a number of tests and cannot tell him why.  Review of Systems  A thorough review of systems was obtained and all systems are negative except as noted in the HPI and PMH.   Patient's Health History    Past Medical History:  Diagnosis Date   CAD (coronary artery disease)    cardiac cath 2010       Diabetes mellitus    Dilatation of aorta (HCC)    Abdominal aortic dopplers on 04/17/2012 showed mild distal abdominal aortic dilatation measuring 3.0x3.2cm   Hyperlipidemia    Hypertension    Obesity     Past Surgical History:  Procedure Laterality Date   CARDIAC CATHETERIZATION     11/16/2008  2.5 x 15 XIENCE V drug-eluting stent   KNEE SURGERY     Right    Family History  Problem Relation Age of Onset   Diabetes Mother    Diabetes Brother    Lung cancer Maternal Grandfather    Diabetes Paternal Grandmother    COPD Paternal Grandfather     Social History   Socioeconomic History   Marital status: Married    Spouse name: Not on file   Number of children: 3   Years of education: Not on file   Highest education level: Not on file  Occupational History    Employer: SELF EMPLOYED  Tobacco Use   Smoking status: Every Day    Current packs/day: 2.00    Average packs/day: 2.0 packs/day for 40.0 years (80.0 ttl pk-yrs)    Types: Cigarettes   Smokeless tobacco: Never  Vaping Use   Vaping status: Never Used  Substance and Sexual Activity   Alcohol use:  Not Currently    Alcohol/week: 1.0 standard drink of alcohol    Types: 1 Standard drinks or equivalent per week   Drug use: No   Sexual activity: Not on file    Comment: erectile dysfunction  Other Topics Concern   Not on file  Social History Narrative   Not on file   Social Determinants of Health   Financial Resource Strain: Not on file  Food Insecurity: Not on file  Transportation Needs: Not on file  Physical Activity: Not on file  Stress: Not on file  Social Connections: Not on file  Intimate Partner Violence: Not on file     Physical Exam   Vitals:   12/10/22 0001  BP: (!) 142/69  Pulse: (!) 58  Resp: 20  Temp: 97.9 F (36.6 C)  SpO2: 96%    CONSTITUTIONAL: Well-appearing, NAD NEURO/PSYCH:  Alert and oriented x 3, no focal deficits EYES:  eyes equal and reactive ENT/NECK:  no LAD, no JVD CARDIO: Regular rate, well-perfused, normal S1 and S2 PULM:  CTAB no wheezing or rhonchi GI/GU:  non-distended, non-tender MSK/SPINE:  No gross deformities, no edema SKIN: Red well-demarcated rash to the bilateral groin   *Additional and/or pertinent findings included in MDM below  Diagnostic and Interventional Summary    EKG Interpretation Date/Time:    Ventricular Rate:    PR Interval:    QRS Duration:    QT Interval:    QTC Calculation:   R Axis:      Text Interpretation:         Labs Reviewed  URINALYSIS, ROUTINE W REFLEX MICROSCOPIC - Abnormal; Notable for the following components:      Result Value   Color, Urine STRAW (*)    Glucose, UA >=500 (*)    All other components within normal limits    No orders to display    Medications - No data to display   Procedures  /  Critical Care Procedures  ED Course and Medical Decision Making  Initial Impression and Ddx Rash on exam is consistent with tinea cruris abdomen is soft and nontender with no rebound guarding or rigidity.  Vital signs are normal, the left lower quadrant abdominal pain has been an  issue for several months, doubt emergent process.  UTI is also considered.  Past medical/surgical history that increases complexity of ED encounter: CAD  Interpretation of Diagnostics Urinalysis normal  Patient Reassessment and Ultimate Disposition/Management     Discharge.  Patient management required discussion with the following services or consulting groups:  None  Complexity of Problems Addressed Acute illness or injury that poses threat of life of bodily function  Additional Data Reviewed and Analyzed Further history obtained from: Further history from spouse/family member  Additional Factors Impacting ED Encounter Risk Prescriptions  Elmer Sow. Pilar Plate, MD [facility] Emergency Medicine [facility] [email or web address]  Final Clinical Impressions(s) / ED Diagnoses     ICD-10-CM   1. Tinea cruris  B35.6       ED Discharge Orders          Ordered    Clotrimazole 1 % LOTN        12/10/22 0140             Discharge Instructions Discussed with and Provided to Patient:    Discharge Instructions      You were evaluated in the Emergency Department and after careful evaluation, we did not find any emergent condition requiring admission or further testing in the hospital.  Your exam/testing today is overall reassuring.  Take the clotrimazole solution twice daily as prescribed, follow-up with your regular doctor and urologist.  Please return to the Emergency Department if you experience any worsening of your condition.   Thank you for allowing [place] to be a part of your care.      Sabas Sous, MD 12/10/22 [phone]

## 2022-12-10 NOTE — ED Notes (Signed)
ED Provider at bedside. 

## 2023-01-07 ENCOUNTER — Ambulatory Visit (HOSPITAL_COMMUNITY)
Admission: RE | Admit: 2023-01-07 | Discharge: 2023-01-07 | Disposition: A | Discharge status: Home / Self Care | Payer: HMO | Source: Ambulatory Visit | Attending: Internal Medicine | Admitting: Internal Medicine

## 2023-01-07 DIAGNOSIS — I2510 Atherosclerotic heart disease of native coronary artery without angina pectoris: Secondary | ICD-10-CM | POA: Insufficient documentation

## 2023-01-07 LAB — ECHOCARDIOGRAM COMPLETE
AR max vel: 2.05 cm2
AV Area VTI: 2.46 cm2
AV Area mean vel: 2.39 cm2
AV Mean grad: 4.9 mmHg
AV Peak grad: 11.6 mmHg
Ao pk vel: 1.7 m/s
Area-P 1/2: 3.01 cm2
Calc EF: 60.8 %
MV VTI: 2.44 cm2
S' Lateral: 3.3 cm
Single Plane A2C EF: 57.4 %
Single Plane A4C EF: 66.9 %

## 2023-01-07 NOTE — Progress Notes (Signed)
  Echocardiogram 2D Echocardiogram has been performed.  [name] 01/07/2023, 9:28 AM

## 2023-01-18 ENCOUNTER — Ambulatory Visit (INDEPENDENT_AMBULATORY_CARE_PROVIDER_SITE_OTHER): Payer: HMO | Admitting: Urology

## 2023-01-18 ENCOUNTER — Encounter: Payer: Self-pay | Admitting: Urology

## 2023-01-18 VITALS — BP 148/82 | HR 57

## 2023-01-18 DIAGNOSIS — N5201 Erectile dysfunction due to arterial insufficiency: Secondary | ICD-10-CM

## 2023-01-18 DIAGNOSIS — R310 Gross hematuria: Secondary | ICD-10-CM | POA: Diagnosis not present

## 2023-01-18 LAB — URINALYSIS, ROUTINE W REFLEX MICROSCOPIC
Bilirubin, UA: NEGATIVE
Ketones, UA: NEGATIVE
Leukocytes,UA: NEGATIVE
Nitrite, UA: NEGATIVE
RBC, UA: NEGATIVE
Specific Gravity, UA: 1.02 (ref 1.005–1.030)
Urobilinogen, Ur: 0.2 mg/dL (ref 0.2–1.0)
pH, UA: 6 (ref 5.0–7.5)

## 2023-01-18 LAB — MICROSCOPIC EXAMINATION
Bacteria, UA: NONE SEEN
RBC, Urine: NONE SEEN /hpf (ref 0–2)
WBC, UA: NONE SEEN /hpf (ref 0–5)

## 2023-01-18 NOTE — Progress Notes (Unsigned)
01/18/2023 12:06 PM   [name] 08/26/1955 [id number]  Referring provider: Assunta Found, MD [address] [place],  [place] [address]  Followup erectile dysfunction   HPI: [name] is a 67yo here for followup for erectile dysfunction. He tried tadalafil 20mg  which failed to give him a firm erection. He denies nay recent gross hematuria. IPSS 12 QOL 2 on no BPH therapy. Urine stream is fair. His stream is stronger when he stands to urinate. Nocturia 1-2x.    PMH: Past Medical History:  Diagnosis Date   CAD (coronary artery disease)    cardiac cath 2010       Diabetes mellitus    Dilatation of aorta (HCC)    Abdominal aortic dopplers on 04/17/2012 showed mild distal abdominal aortic dilatation measuring 3.0x3.2cm   Hyperlipidemia    Hypertension    Obesity     Surgical History: Past Surgical History:  Procedure Laterality Date   CARDIAC CATHETERIZATION     11/16/2008  2.5 x 15 XIENCE V drug-eluting stent   KNEE SURGERY     Right    Home Medications:  Allergies as of 01/18/2023       Reactions   Shellfish-derived Products         Medication List        Accurate as of January 18, 2023 12:06 PM. If you have any questions, ask your nurse or doctor.          amLODipine 2.5 MG tablet Commonly known as: NORVASC Take 1 tablet (2.5 mg total) by mouth daily.   aspirin EC 81 MG tablet Take 1 tablet (81 mg total) by mouth daily. Swallow whole.   Clotrimazole 1 % Lotn Apply to rash twice daily until rash goes away.   Fish Oil 1000 MG Caps Take 1,000 mg by mouth daily.   Janumet 50-1000 MG tablet Generic drug: sitaGLIPtin-metformin Take 1 tablet by mouth 2 (two) times daily.   magnesium oxide 400 (240 Mg) MG tablet Commonly known as: MAG-OX Take 400 mg by mouth daily.   rosuvastatin 20 MG tablet Commonly known as: CRESTOR Take 20 mg by mouth daily.   tadalafil 20 MG tablet Commonly known as: CIALIS Take 1 tablet (20 mg total) by mouth  daily as needed.        Allergies:  Allergies  Allergen Reactions   Shellfish-Derived Products     Family History: Family History  Problem Relation Age of Onset   Diabetes Mother    Diabetes Brother    Lung cancer Maternal Grandfather    Diabetes Paternal Grandmother    COPD Paternal Grandfather     Social History:  reports that he has been smoking cigarettes. He has a 80 pack-year smoking history. He has never used smokeless tobacco. He reports that he does not currently use alcohol after a past usage of about 1.0 standard drink of alcohol per week. He reports that he does not use drugs.  ROS: All other review of systems were reviewed and are negative except what is noted above in HPI  Physical Exam: BP (!) 148/82   Pulse (!) 57   Constitutional:  Alert and oriented, No acute distress. HEENT: [place] AT, moist mucus membranes.  Trachea midline, no masses. Cardiovascular: No clubbing, cyanosis, or edema. Respiratory: Normal respiratory effort, no increased work of breathing. GI: Abdomen is soft, nontender, nondistended, no abdominal masses GU: No CVA tenderness.  Lymph: No cervical or inguinal lymphadenopathy. Skin: No rashes, bruises or suspicious lesions. Neurologic: Grossly  intact, no focal deficits, moving all 4 extremities. Psychiatric: Normal mood and affect.  Laboratory Data: Lab Results  Component Value Date   WBC 11.3 (H) 08/01/2022   HGB 14.5 08/01/2022   HCT 42.4 08/01/2022   MCV 87.8 08/01/2022   PLT 312 08/01/2022    Lab Results  Component Value Date   CREATININE 0.87 08/01/2022    No results found for: "PSA"  No results found for: "TESTOSTERONE"  No results found for: "HGBA1C"  Urinalysis    Component Value Date/Time   COLORURINE STRAW (A) 12/10/2022 0017   APPEARANCEUR CLEAR 12/10/2022 0017   APPEARANCEUR Clear 09/28/2022 0854   LABSPEC 1.006 12/10/2022 0017   PHURINE 6.0 12/10/2022 0017   GLUCOSEU >=500 (A) 12/10/2022 0017   HGBUR  NEGATIVE 12/10/2022 0017   BILIRUBINUR NEGATIVE 12/10/2022 0017   BILIRUBINUR Negative 09/28/2022 0854   KETONESUR NEGATIVE 12/10/2022 0017   PROTEINUR NEGATIVE 12/10/2022 0017   UROBILINOGEN 0.2 12/14/2010 2204   NITRITE NEGATIVE 12/10/2022 0017   LEUKOCYTESUR NEGATIVE 12/10/2022 0017    Lab Results  Component Value Date   LABMICR See below: 09/28/2022   WBCUA 0-5 09/28/2022   LABEPIT 0-10 09/28/2022   MUCUS Present 10/14/2020   BACTERIA NONE SEEN 12/10/2022    Pertinent Imaging: *** No results found for this or any previous visit.  No results found for this or any previous visit.  No results found for this or any previous visit.  No results found for this or any previous visit.  No results found for this or any previous visit.  No valid procedures specified. No results found for this or any previous visit.  Results for orders placed during the hospital encounter of 07/28/20  CT Renal Stone Study  Narrative CLINICAL DATA:  Hematuria  EXAM: CT ABDOMEN AND PELVIS WITHOUT CONTRAST  TECHNIQUE: Multidetector CT imaging of the abdomen and pelvis was performed following the standard protocol without IV contrast.  COMPARISON:  02/14/2020  FINDINGS: Lower chest: The visualized lung bases are clear. At least moderate right coronary artery calcification. Global cardiac size within normal limits.  Hepatobiliary: Cholelithiasis without pericholecystic inflammatory change. Liver unremarkable. No intra or extrahepatic biliary ductal dilation.  Pancreas: Unremarkable  Spleen: Unremarkable  Adrenals/Urinary Tract: 2 cm adrenal adenoma within the lateral limb and 12 mm adrenal adenoma within the body of the left adrenal gland are again identified and are unchanged. Right adrenal gland is unremarkable. Kidneys are normal in size and position and are unremarkable. Bladder is unremarkable.  Stomach/Bowel: Stomach is within normal limits. Appendix appears normal. No  evidence of bowel wall thickening, distention, or inflammatory changes. No free intraperitoneal gas or fluid  Vascular/Lymphatic: 3.8 cm fusiform infrarenal abdominal aortic aneurysm, stable since prior examination. Moderate superimposed aortoiliac atherosclerotic calcification.  No pathologic adenopathy within the abdomen and pelvis.  Reproductive: Mild prostatic enlargement. Seminal vesicles are unremarkable.  Other: Small fat containing left inguinal hernia. Small fat containing umbilical hernia.  Musculoskeletal: Degenerative changes are seen within the lumbar spine. No lytic or blastic bone lesions are seen.  IMPRESSION: No acute intra-abdominal pathology. No definite radiographic explanation for the patient's reported symptoms.  Cholelithiasis  Multiple left adrenal adenomas, unchanged.  Stable 3.8 cm infrarenal abdominal aortic aneurysm. Recommend follow-up ultrasound every 2 years. This recommendation follows ACR consensus guidelines: White Paper of the ACR Incidental Findings Committee II on Vascular Findings. J Am Coll Radiol 2013; 10:789-794.  Aortic aneurysm NOS (ICD10-I71.9).   Electronically Signed By: Helyn Numbers MD On: 07/29/2020 01:39  Assessment & Plan:    1. Erectile dysfunction due to arterial insufficiency *** - Urinalysis, Routine w reflex microscopic   No follow-ups on file.  Wilkie Aye, MD  [facility] [place]

## 2023-01-23 ENCOUNTER — Encounter: Payer: Self-pay | Admitting: Urology

## 2023-01-23 NOTE — Patient Instructions (Signed)

## 2023-02-18 ENCOUNTER — Other Ambulatory Visit: Payer: Self-pay | Admitting: Internal Medicine

## 2023-03-13 ENCOUNTER — Encounter: Payer: Self-pay | Admitting: Internal Medicine

## 2023-03-13 ENCOUNTER — Ambulatory Visit: Payer: HMO | Attending: Internal Medicine | Admitting: Internal Medicine

## 2023-03-13 VITALS — BP 130/80 | HR 74 | Ht 72.0 in | Wt 216.8 lb

## 2023-03-13 DIAGNOSIS — I25119 Atherosclerotic heart disease of native coronary artery with unspecified angina pectoris: Secondary | ICD-10-CM | POA: Diagnosis not present

## 2023-03-13 NOTE — Progress Notes (Signed)
Cardiology Office Note  Date: 03/13/2023   ID: [name], DOB 1955-09-19, MRN [id number]  PCP:  Assunta Found, MD  Cardiologist:  Marjo Bicker, MD Electrophysiologist:  None    History of Present Illness: [name] is a 67 y.o. 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  Patient is currently unemployed, has no job and whenever bills pile up, he noticed he was getting stressed out and started to get chest pains.  Due to boredom, he smokes.  He is not much active at home, although can perform his daily activities, household chores and yard work with no terms of angina.  His ambulation is mostly limited by hip pain.  He also has stable claudication.  No true angina, no chest pain with exertion.  No DOE, orthopnea, PND.  He does have gasping for air at rest episodes of the last 6 months.  Echocardiogram performed in 12/2022 showed normal LVEF, G1 DD with normal LVEDP and no valvular heart disease.  Current smoker, smokes 2 packs/day, previously tried nicotine patches but had blisters on his arm.  No dizziness, lightheadedness, presyncope and syncope.  No palpitations or leg swelling.  Past Medical History:  Diagnosis Date   CAD (coronary artery disease)    cardiac cath 2010       Diabetes mellitus    Dilatation of aorta (HCC)    Abdominal aortic dopplers on 04/17/2012 showed mild distal abdominal aortic dilatation measuring 3.0x3.2cm   Hyperlipidemia    Hypertension    Obesity     Past Surgical History:  Procedure Laterality Date   CARDIAC CATHETERIZATION     11/16/2008  2.5 x 15 XIENCE V drug-eluting stent   KNEE SURGERY     Right    Current Outpatient Medications  Medication Sig Dispense Refill   amLODipine (NORVASC) 2.5 MG tablet Take 1 tablet by mouth once daily 30 tablet 3   aspirin EC 81 MG  tablet Take 1 tablet (81 mg total) by mouth daily. Swallow whole. 30 tablet 12   Clotrimazole 1 % LOTN Apply to rash twice daily until rash goes away. 30 mL 3   JANUMET 50-1000 MG tablet Take 1 tablet by mouth 2 (two) times daily.     magnesium oxide (MAG-OX) 400 (240 Mg) MG tablet Take 400 mg by mouth daily.     metFORMIN (GLUCOPHAGE) 500 MG tablet Take 500 mg by mouth 2 (two) times daily.     Omega-3 Fatty Acids (FISH OIL) 1000 MG CAPS Take 1,000 mg by mouth daily.     rosuvastatin (CRESTOR) 20 MG tablet Take 20 mg by mouth daily.     tadalafil (CIALIS) 20 MG tablet Take 1 tablet (20 mg total) by mouth daily as needed. 30 tablet 5   No current facility-administered medications for this visit.   Allergies:  Shellfish-derived products   Social History: The patient  reports that he has been smoking cigarettes. He has a 80 pack-year smoking history. He has never used smokeless tobacco. He reports that he does not currently use alcohol after a past usage of about 1.0 standard drink of alcohol per week. He reports that he does not use drugs.   Family History: The patient's family history includes COPD in his paternal grandfather; Diabetes in his brother, mother, and  paternal grandmother; Lung cancer in his maternal grandfather.   ROS:  Please see the history of present illness. Otherwise, complete review of systems is positive for none.  All other systems are reviewed and negative.   Physical Exam: VS:  Ht 6' (1.829 m)   Wt 216 lb 12.8 oz (98.3 kg)   BMI 29.40 kg/m , BMI Body mass index is 29.4 kg/m.  Wt Readings from Last 3 Encounters:  03/13/23 216 lb 12.8 oz (98.3 kg)  12/10/22 220 lb (99.8 kg)  11/28/22 221 lb (100.2 kg)    General: Patient appears comfortable at rest. HEENT: Conjunctiva and lids normal, oropharynx clear with moist mucosa. Neck: Supple, no elevated JVP or carotid bruits, no thyromegaly. Lungs: Clear to auscultation, nonlabored breathing at rest. Cardiac: Regular  rate and rhythm, no S3 or significant systolic murmur, no pericardial rub. Abdomen: Soft, nontender, no hepatomegaly, bowel sounds present, no guarding or rebound. Extremities: No pitting edema, distal pulses 2+. Skin: Warm and dry. Musculoskeletal: No kyphosis. Neuropsychiatric: Alert and oriented x3, affect grossly appropriate.  Recent Labwork: 08/01/2022: ALT 15; AST 18; BUN 10; Creatinine, Ser 0.87; Hemoglobin 14.5; Platelets 312; Potassium 4.3; Sodium 135  No results found for: "CHOL", "TRIG", "HDL", "CHOLHDL", "VLDL", "LDLCALC", "LDLDIRECT"   Assessment and Plan:  # CAD manifested by ACS in 2010 s/p LCx PCI -Ongoing chest pains with stress, no chest pain with exertion. Cannot start Imdur due to concurrent use of tadalafil. HR okay today, will defer addition of beta-blockers due to absence of true angina. Continue amlodipine 2.5 mg once daily.  Continue cardioprotective medications with aspirin 81 mg once daily, rosuvastatin 20 mg nightly.  ER precautions for chest pain provided.  Echocardiogram from 12/2022 showed normal LVEF, G1 DD with normal LVEDP and no valvular heart disease.  # PAD # Infrarenal AAA -CT imaging from 3/24 showed fusiform infrarenal abdominal aortic aneurysm measuring 3.7 centimeter in maximal diameter. -ABI in 3/24 showed 0.84 on the right and 1.00 on the left lower extremity. Continues to have stable claudication.  Follows up with vascular surgery.  Continue cardioprotective medications with aspirin 81 mg once daily, high intensity statin, rosuvastatin 20 mg nightly, strongly encourage smoking cessation.  # HLD, not at goal -LDL 123 in 2022.  Continue rosuvastatin 20 mg nightly, goal LDL less than 55 (due to PAD and CAD).  Will repeat lipid panel today..  # Nicotine abuse -Smokes 2 packs/day, had blisters with nicotine patches.  Not motivated to quit at this time as he tried everything on the market.   I have spent a total duration of 30 minutes reviewing the  specialist notes, imaging studies, face-to-face counseling about CAD, PAD and smoking, reviewed the medications, ordered labs and documenting the findings in the note.   Disposition:  Follow up  6 months  Signed, Delana Manganello Verne Spurr, MD, 03/13/2023 8:38 AM    [facility] at [facility] [address], [place], [place] [address]

## 2023-03-13 NOTE — Patient Instructions (Signed)
Medication Instructions:  Your physician recommends that you continue on your current medications as directed. Please refer to the Current Medication list given to you today.  *If you need a refill on your cardiac medications before your next appointment, please call your pharmacy*   Lab Work: Fasting Lipid Panel- nothing to eat/drink at least 6 hours prior to having labs drawn.   If you have labs (blood work) drawn today and your tests are completely normal, you will receive your results only by: MyChart Message (if you have MyChart) OR A paper copy in the mail If you have any lab test that is abnormal or we need to change your treatment, we will call you to review the results.   Testing/Procedures: None   Follow-Up: At [facility], you and your health needs are our priority.  As part of our continuing mission to provide you with exceptional heart care, we have created designated Provider Care Teams.  These Care Teams include your primary Cardiologist (physician) and Advanced Practice Providers (APPs -  Physician Assistants and Nurse Practitioners) who all work together to provide you with the care you need, when you need it.  We recommend signing up for the patient portal called "MyChart".  Sign up information is provided on this After Visit Summary.  MyChart is used to connect with patients for Virtual Visits (Telemedicine).  Patients are able to view lab/test results, encounter notes, upcoming appointments, etc.  Non-urgent messages can be sent to your provider as well.   To learn more about what you can do with MyChart, go to [email or web address].    Your next appointment:   6 month(s)  Provider:   Luane School, MD    Other Instructions

## 2023-03-19 ENCOUNTER — Encounter (HOSPITAL_COMMUNITY): Payer: Self-pay

## 2023-03-19 ENCOUNTER — Emergency Department (HOSPITAL_COMMUNITY)
Admission: EM | Admit: 2023-03-19 | Discharge: 2023-03-19 | Disposition: A | Discharge status: Home / Self Care | Payer: HMO | Attending: Student | Admitting: Student

## 2023-03-19 ENCOUNTER — Other Ambulatory Visit: Payer: Self-pay

## 2023-03-19 ENCOUNTER — Emergency Department (HOSPITAL_COMMUNITY): Payer: HMO

## 2023-03-19 DIAGNOSIS — M19011 Primary osteoarthritis, right shoulder: Secondary | ICD-10-CM | POA: Diagnosis not present

## 2023-03-19 DIAGNOSIS — M25551 Pain in right hip: Secondary | ICD-10-CM | POA: Diagnosis not present

## 2023-03-19 DIAGNOSIS — I2510 Atherosclerotic heart disease of native coronary artery without angina pectoris: Secondary | ICD-10-CM | POA: Insufficient documentation

## 2023-03-19 DIAGNOSIS — Z7982 Long term (current) use of aspirin: Secondary | ICD-10-CM | POA: Diagnosis not present

## 2023-03-19 DIAGNOSIS — F17210 Nicotine dependence, cigarettes, uncomplicated: Secondary | ICD-10-CM | POA: Insufficient documentation

## 2023-03-19 DIAGNOSIS — W010XXA Fall on same level from slipping, tripping and stumbling without subsequent striking against object, initial encounter: Secondary | ICD-10-CM | POA: Diagnosis not present

## 2023-03-19 DIAGNOSIS — Z79899 Other long term (current) drug therapy: Secondary | ICD-10-CM | POA: Diagnosis not present

## 2023-03-19 DIAGNOSIS — E119 Type 2 diabetes mellitus without complications: Secondary | ICD-10-CM | POA: Diagnosis not present

## 2023-03-19 DIAGNOSIS — I10 Essential (primary) hypertension: Secondary | ICD-10-CM | POA: Insufficient documentation

## 2023-03-19 DIAGNOSIS — M1611 Unilateral primary osteoarthritis, right hip: Secondary | ICD-10-CM | POA: Diagnosis not present

## 2023-03-19 DIAGNOSIS — M25511 Pain in right shoulder: Secondary | ICD-10-CM | POA: Insufficient documentation

## 2023-03-19 DIAGNOSIS — W19XXXA Unspecified fall, initial encounter: Secondary | ICD-10-CM

## 2023-03-19 MED ORDER — OXYCODONE-ACETAMINOPHEN 5-325 MG PO TABS
1.0000 | ORAL_TABLET | Freq: Once | ORAL | Status: AC
  Administered 2023-03-19: 1 via ORAL
  Filled 2023-03-19: qty 1

## 2023-03-19 MED ORDER — ACETAMINOPHEN 500 MG PO TABS: 1000.0000 mg | ORAL_TABLET | Freq: Three times a day (TID) | ORAL | 0 refills | Status: AC

## 2023-03-19 MED ORDER — OXYCODONE HCL 5 MG PO TABS: 5.0000 mg | ORAL_TABLET | ORAL | 0 refills | Status: DC | PRN

## 2023-03-19 MED ORDER — NAPROXEN 250 MG PO TABS
500.0000 mg | ORAL_TABLET | Freq: Once | ORAL | Status: AC
  Administered 2023-03-19: 500 mg via ORAL
  Filled 2023-03-19: qty 2

## 2023-03-19 MED ORDER — NAPROXEN 375 MG PO TABS: 375.0000 mg | ORAL_TABLET | Freq: Two times a day (BID) | ORAL | 0 refills | Status: DC

## 2023-03-19 NOTE — ED Triage Notes (Signed)
Pt states he fell onto his right side about 2 hours ago after tripping.  Pt states he can't move his right shoulder now without extreme pain.  Also c/o right hip pain. Denies head injury. Pt takes aspirin daily but doesn't know what all his other medications are.

## 2023-03-19 NOTE — Discharge Instructions (Addendum)
For pain:  - Acetaminophen 1000 mg three times daily (every 8 hours) - Naproxen 2 times daily (every 12 hours) - oxycodone for breakthrough pain only 

## 2023-03-20 NOTE — ED Provider Notes (Signed)
[place] EMERGENCY DEPARTMENT AT [facility] Provider Note  CSN: [id number] Arrival date & time: 03/19/23 1934  Chief Complaint(s) Fall  HPI [name] is a 67 y.o. male with PMH CAD, HTN, HLD who presents to the emergency department for evaluation of a fall.  Patient states that he tripped and fell onto his right side approximately 2 hours prior to arrival.  He arrives with difficulty moving the right shoulder and some mild pain at the right hip.  Denies head strike or loss of consciousness.  Denies chest pain, shortness of breath, abdominal pain, nausea, vomiting or other systemic symptoms.  Denies numbness, tingling, weakness or other neurologic complaints.   Past Medical History Past Medical History:  Diagnosis Date   CAD (coronary artery disease)    cardiac cath 2010       Diabetes mellitus    Dilatation of aorta (HCC)    Abdominal aortic dopplers on 04/17/2012 showed mild distal abdominal aortic dilatation measuring 3.0x3.2cm   Hyperlipidemia    Hypertension    Obesity    Patient Active Problem List   Diagnosis Date Noted   PAD (peripheral artery disease) (HCC) 11/28/2022   AAA (abdominal aortic aneurysm) without rupture (HCC) 11/28/2022   Severe sinus bradycardia 11/28/2022   Tobacco dependence 04/03/2013   Erectile dysfunction 04/03/2013   CAD (coronary artery disease) 04/03/2013   Degenerative disc disease, lumbar 05/27/2012   Spinal stenosis 05/27/2012   Impingement syndrome, shoulder 05/27/2012   LLQ pain 08/28/2010   HYPERLIPIDEMIA-MIXED 01/17/2009   Overweight 01/17/2009   Essential hypertension 01/17/2009   CAD, NATIVE VESSEL 01/17/2009   Home Medication(s) Prior to Admission medications   Medication Sig Start Date End Date Taking? Authorizing Provider  acetaminophen (TYLENOL) 500 MG tablet Take 2 tablets (1,000 mg total) by mouth every 8 (eight) hours. 03/19/23 04/18/23 Yes Colton Engdahl, MD  naproxen (NAPROSYN) 375 MG tablet Take 1  tablet (375 mg total) by mouth 2 (two) times daily. 03/19/23  Yes Elayna Tobler, MD  oxyCODONE (ROXICODONE) 5 MG immediate release tablet Take 1 tablet (5 mg total) by mouth every 4 (four) hours as needed for severe pain (pain score 7-10). 03/19/23  Yes Xiao Graul, MD  amLODipine (NORVASC) 2.5 MG tablet Take 1 tablet by mouth once daily 02/18/23   Mallipeddi, Vishnu P, MD  aspirin EC 81 MG tablet Take 1 tablet (81 mg total) by mouth daily. Swallow whole. 11/28/22   Mallipeddi, Vishnu P, MD  Clotrimazole 1 % LOTN Apply to rash twice daily until rash goes away. 12/10/22   Sabas Sous, MD  JANUMET 50-1000 MG tablet Take 1 tablet by mouth 2 (two) times daily. 06/05/22   [provider]  magnesium oxide (MAG-OX) 400 (240 Mg) MG tablet Take 400 mg by mouth daily.    [provider]  metFORMIN (GLUCOPHAGE) 500 MG tablet Take 500 mg by mouth 2 (two) times daily. 01/30/23   [provider]  Omega-3 Fatty Acids (FISH OIL) 1000 MG CAPS Take 1,000 mg by mouth daily.    [provider]  rosuvastatin (CRESTOR) 20 MG tablet Take 20 mg by mouth daily. 09/04/22   [provider]  tadalafil (CIALIS) 20 MG tablet Take 1 tablet (20 mg total) by mouth daily as needed. 09/28/22   McKenzie, Mardene Celeste, MD  Past Surgical History Past Surgical History:  Procedure Laterality Date   CARDIAC CATHETERIZATION     11/16/2008  2.5 x 15 XIENCE V drug-eluting stent   KNEE SURGERY     Right   Family History Family History  Problem Relation Age of Onset   Diabetes Mother    Diabetes Brother    Lung cancer Maternal Grandfather    Diabetes Paternal Grandmother    COPD Paternal Grandfather     Social History Social History   Tobacco Use   Smoking status: Every Day    Current packs/day: 2.00    Average packs/day: 2.0 packs/day for 40.0 years (80.0  ttl pk-yrs)    Types: Cigarettes   Smokeless tobacco: Never  Vaping Use   Vaping status: Never Used  Substance Use Topics   Alcohol use: Not Currently    Alcohol/week: 1.0 standard drink of alcohol    Types: 1 Standard drinks or equivalent per week   Drug use: No   Allergies Shellfish-derived products  Review of Systems Review of Systems  Musculoskeletal:  Positive for arthralgias, joint swelling and myalgias.    Physical Exam Vital Signs  I have reviewed the triage vital signs BP 130/70 (BP Location: Left Arm)   Pulse 72   Temp 98 F (36.7 C) (Oral)   Resp 16   Ht 6' (1.829 m)   Wt 95.3 kg   SpO2 98%   BMI 28.48 kg/m   Physical Exam Constitutional:      General: He is not in acute distress.    Appearance: Normal appearance.  HENT:     Head: Normocephalic and atraumatic.     Nose: No congestion or rhinorrhea.  Eyes:     General:        Right eye: No discharge.        Left eye: No discharge.     Extraocular Movements: Extraocular movements intact.     Pupils: Pupils are equal, round, and reactive to light.  Cardiovascular:     Rate and Rhythm: Normal rate and regular rhythm.     Heart sounds: No murmur heard. Pulmonary:     Effort: No respiratory distress.     Breath sounds: No wheezing or rales.  Abdominal:     General: There is no distension.     Tenderness: There is no abdominal tenderness.  Musculoskeletal:        General: Tenderness present. Normal range of motion.     Cervical back: Normal range of motion.  Skin:    General: Skin is warm and dry.  Neurological:     General: No focal deficit present.     Mental Status: He is alert.     ED Results and Treatments Labs (all labs ordered are listed, but only abnormal results are displayed) Labs Reviewed - No data to display  Radiology DG Hip Unilat W or Wo Pelvis 2-3 Views  Right  Result Date: 03/19/2023 CLINICAL DATA:  Pain after fall. EXAM: DG HIP (WITH OR WITHOUT PELVIS) 2-3V RIGHT COMPARISON:  None Available. FINDINGS: No evidence of acute fracture of the pelvis or right hip. No hip dislocation. Moderate hip osteoarthritis with mild joint space narrowing and acetabular osteophytes. Bony pelvis including pubic rami are intact. No pubic symphyseal or sacroiliac diastasis. IMPRESSION: 1. No acute fracture or dislocation of the pelvis or right hip. 2. Moderate hip osteoarthritis. Electronically Signed   By: Narda Rutherford M.D.   On: 03/19/2023 21:19   DG Shoulder Right  Result Date: 03/19/2023 CLINICAL DATA:  Pain after fall. EXAM: RIGHT SHOULDER - 2+ VIEW COMPARISON:  None Available. FINDINGS: There is no evidence of fracture or dislocation. Acromioclavicular and glenohumeral degenerative change. Soft tissues are unremarkable. IMPRESSION: 1. No fracture or dislocation of the right shoulder. 2. Acromioclavicular and glenohumeral degenerative change. Electronically Signed   By: Narda Rutherford M.D.   On: 03/19/2023 21:11    Pertinent labs & imaging results that were available during my care of the patient were reviewed by me and considered in my medical decision making (see MDM for details).  Medications Ordered in ED Medications  naproxen (NAPROSYN) tablet 500 mg (500 mg Oral Given 03/19/23 2127)  oxyCODONE-acetaminophen (PERCOCET/ROXICET) 5-325 MG per tablet 1 tablet (1 tablet Oral Given 03/19/23 2245)                                                                                                                                     Procedures .Ortho Injury Treatment  Date/Time: 03/20/2023 12:58 PM  Performed by: Glendora Score, MD Authorized by: Glendora Score, MD   Consent:    Consent obtained:  Verbal   Consent given by:  Patient   Risks discussed:  Fracture, nerve damage, restricted joint movement and vascular damage   Alternatives discussed:  No  treatment and alternative treatmentInjury location: upper arm Location details: right upper arm Pre-procedure distal perfusion: normal Pre-procedure neurological function: normal Pre-procedure range of motion: reduced Immobilization: sling Splint Applied by: ED Nurse Post-procedure distal perfusion: normal Post-procedure neurological function: normal Post-procedure range of motion: unchanged     (including critical care time)  Medical Decision Making / ED Course   This patient presents to the ED for concern of fall, this involves an extensive number of treatment options, and is a complaint that carries with it a high risk of complications and morbidity.  The differential diagnosis includes fracture, dislocation, ligamentous injury, hematoma, contusion, dislocation, rotator cuff injury  MDM: Patient seen the emergency room for evaluation of a fall with shoulder and hip pain.  Physical exam with significant tenderness over the [facility] joint, supraspinatus and lateral deltoid on the right, mild tenderness at the hip on the right.  X-ray imaging reassuringly negative for acute traumatic injury including fracture or dislocation of either  the hip or shoulder.  Patient pain controlled in the emergency department and patient placed in a sling for comfort.  Suspect possible rotator cuff injury.  At this time with negative trauma imaging he does not meet inpatient criteria for admission and is safe for discharge with outpatient orthopedics follow-up.  I placed an outpatient orthopedic referral and patient discharged with pain control.   Additional history obtained: -Additional history obtained from wife -External records from outside source obtained and reviewed including: Chart review including previous notes, labs, imaging, consultation notes    Imaging Studies ordered: I ordered imaging studies including shoulder x-ray, hip x-ray I independently visualized and interpreted imaging. I agree with  the radiologist interpretation   Medicines ordered and prescription drug management: Meds ordered this encounter  Medications   naproxen (NAPROSYN) tablet 500 mg   oxyCODONE-acetaminophen (PERCOCET/ROXICET) 5-325 MG per tablet 1 tablet   acetaminophen (TYLENOL) 500 MG tablet    Sig: Take 2 tablets (1,000 mg total) by mouth every 8 (eight) hours.    Dispense:  180 tablet    Refill:  0   naproxen (NAPROSYN) 375 MG tablet    Sig: Take 1 tablet (375 mg total) by mouth 2 (two) times daily.    Dispense:  20 tablet    Refill:  0   oxyCODONE (ROXICODONE) 5 MG immediate release tablet    Sig: Take 1 tablet (5 mg total) by mouth every 4 (four) hours as needed for severe pain (pain score 7-10).    Dispense:  10 tablet    Refill:  0    -I have reviewed the patients home medicines and have made adjustments as needed  Critical interventions none    Social Determinants of Health:  Factors impacting patients care include: none   Reevaluation: After the interventions noted above, I reevaluated the patient and found that they have :improved  Co morbidities that complicate the patient evaluation  Past Medical History:  Diagnosis Date   CAD (coronary artery disease)    cardiac cath 2010       Diabetes mellitus    Dilatation of aorta (HCC)    Abdominal aortic dopplers on 04/17/2012 showed mild distal abdominal aortic dilatation measuring 3.0x3.2cm   Hyperlipidemia    Hypertension    Obesity       Dispostion: I considered admission for this patient, but at this time with negative trauma imaging he does not meet inpatient criteria for admission he is safe for discharge with outpatient follow-up     Final Clinical Impression(s) / ED Diagnoses Final diagnoses:  Acute pain of right shoulder  Fall, initial encounter     @PCDICTATION @    Glendora Score, MD 03/20/23 1259

## 2023-03-26 ENCOUNTER — Ambulatory Visit (INDEPENDENT_AMBULATORY_CARE_PROVIDER_SITE_OTHER): Payer: HMO | Admitting: Orthopedic Surgery

## 2023-03-26 ENCOUNTER — Other Ambulatory Visit: Payer: Self-pay

## 2023-03-26 ENCOUNTER — Other Ambulatory Visit (INDEPENDENT_AMBULATORY_CARE_PROVIDER_SITE_OTHER): Payer: HMO

## 2023-03-26 ENCOUNTER — Encounter: Payer: Self-pay | Admitting: Orthopedic Surgery

## 2023-03-26 VITALS — BP 156/76 | HR 65 | Ht 72.0 in | Wt 220.0 lb

## 2023-03-26 DIAGNOSIS — M25511 Pain in right shoulder: Secondary | ICD-10-CM

## 2023-03-26 DIAGNOSIS — W19XXXA Unspecified fall, initial encounter: Secondary | ICD-10-CM | POA: Diagnosis not present

## 2023-03-26 MED ORDER — HYDROCODONE-ACETAMINOPHEN 5-325 MG PO TABS: 1.0000 | ORAL_TABLET | Freq: Four times a day (QID) | ORAL | 0 refills | Status: DC | PRN

## 2023-03-26 MED ORDER — PREDNISONE 10 MG (21) PO TBPK: ORAL_TABLET | ORAL | 0 refills | Status: DC

## 2023-03-26 MED ORDER — AMLODIPINE BESYLATE 2.5 MG PO TABS: 2.5000 mg | ORAL_TABLET | Freq: Every day | ORAL | 3 refills | Status: DC

## 2023-03-26 NOTE — Telephone Encounter (Signed)
Refilled amlodipine 2.5 mg every day, #90, RF:3  to [facility]

## 2023-03-26 NOTE — Patient Instructions (Signed)
Provided prescription for prednisone.  While taking this medication, please do not take any ibuprofen or naproxen.  Limited supply of hydrocodone has been provided.  I would not continue to provide pain medications.   Follow-up in 2-3 weeks for repeat evaluation.

## 2023-03-26 NOTE — Progress Notes (Unsigned)
New Patient Visit  Assessment: [name] is a 67 y.o. male with the following: 1. Acute pain of right shoulder  Plan: RAMELO CRUZHERNANDEZ sustained a fall, landing on his right shoulder approximately 1 week ago.  He continues to have significant pain.  Repeat radiographs in clinic demonstrates that there are no acute injuries.  The shoulder is located.  He is very painful, and tolerates little range of motion.  Given the severity of his pain, after the fall, as well as the limited range of motion, I am recommending a short course of prednisone.  I would like to see him back in a couple of weeks for repeat evaluation.  I was provided him with a short course of pain medications, but informed him that I would not continue to provide this medication.  Follow-up: Return in about 2 weeks (around 04/09/2023).  Subjective:  Chief Complaint  Patient presents with   Shoulder Pain    R shoulder pain after a fall landing on the shoulder DOI 03/19/23    History of Present Illness: [name] is a 67 y.o. male who presents for evaluation of right shoulder pain.  Approximately 1 week ago, he tripped and fell.  He tried to brace himself with his right arm.  He had immediate pain.  He presented to the emergency department.  Radiographs were negative.  He has been taking oxycodone for pain.  He continues to have difficulty with motion.  No prior injuries to his right shoulder.  He is hopeful to avoid surgery.   Review of Systems: No fevers or chills No numbness or tingling No chest pain No shortness of breath No bowel or bladder dysfunction No GI distress No headaches   Medical History:  Past Medical History:  Diagnosis Date   CAD (coronary artery disease)    cardiac cath 2010       Diabetes mellitus    Dilatation of aorta (HCC)    Abdominal aortic dopplers on 04/17/2012 showed mild distal abdominal aortic dilatation measuring 3.0x3.2cm   Hyperlipidemia    Hypertension    Obesity      Past Surgical History:  Procedure Laterality Date   CARDIAC CATHETERIZATION     11/16/2008  2.5 x 15 XIENCE V drug-eluting stent   KNEE SURGERY     Right    Family History  Problem Relation Age of Onset   Diabetes Mother    Diabetes Brother    Lung cancer Maternal Grandfather    Diabetes Paternal Grandmother    COPD Paternal Grandfather    Social History   Tobacco Use   Smoking status: Every Day    Current packs/day: 2.00    Average packs/day: 2.0 packs/day for 40.0 years (80.0 ttl pk-yrs)    Types: Cigarettes   Smokeless tobacco: Never  Vaping Use   Vaping status: Never Used  Substance Use Topics   Alcohol use: Not Currently    Alcohol/week: 1.0 standard drink of alcohol    Types: 1 Standard drinks or equivalent per week   Drug use: No    Allergies  Allergen Reactions   Shellfish-Derived Products     Current Meds  Medication Sig   HYDROcodone-acetaminophen (NORCO/VICODIN) 5-325 MG tablet Take 1 tablet by mouth every 6 (six) hours as needed.   predniSONE (STERAPRED UNI-PAK 21 TAB) 10 MG (21) TBPK tablet 10 mg DS 12 as directed    Objective: BP (!) 156/76   Pulse 65   Ht 6' (1.829 m)  Wt 220 lb (99.8 kg)   BMI 29.84 kg/m   Physical Exam:  General: {General PE Findings:25791} Gait: {Gait:25792}    IMAGING: {XR Reviewed:24899}   New Medications:  Meds ordered this encounter  Medications   predniSONE (STERAPRED UNI-PAK 21 TAB) 10 MG (21) TBPK tablet    Sig: 10 mg DS 12 as directed    Dispense:  48 tablet    Refill:  0   HYDROcodone-acetaminophen (NORCO/VICODIN) 5-325 MG tablet    Sig: Take 1 tablet by mouth every 6 (six) hours as needed.    Dispense:  20 tablet    Refill:  0      Oliver Barre, MD  03/26/2023 3:55 PM

## 2023-03-28 ENCOUNTER — Other Ambulatory Visit: Payer: Self-pay

## 2023-03-28 MED ORDER — AMLODIPINE BESYLATE 2.5 MG PO TABS: 2.5000 mg | ORAL_TABLET | Freq: Every day | ORAL | 1 refills | Status: DC

## 2023-03-28 NOTE — Telephone Encounter (Signed)
Refilled amlodipine 2.5 mg every day to [facility]

## 2023-04-09 ENCOUNTER — Ambulatory Visit (INDEPENDENT_AMBULATORY_CARE_PROVIDER_SITE_OTHER): Payer: HMO | Admitting: Orthopedic Surgery

## 2023-04-09 ENCOUNTER — Encounter: Payer: Self-pay | Admitting: Orthopedic Surgery

## 2023-04-09 DIAGNOSIS — M25511 Pain in right shoulder: Secondary | ICD-10-CM

## 2023-04-09 MED ORDER — METHYLPREDNISOLONE ACETATE 40 MG/ML IJ SUSP
40.0000 mg | Freq: Once | INTRAMUSCULAR | Status: AC
  Administered 2023-04-09: 40 mg via INTRA_ARTICULAR

## 2023-04-09 MED ORDER — HYDROCODONE-ACETAMINOPHEN 5-325 MG PO TABS: 1.0000 | ORAL_TABLET | Freq: Four times a day (QID) | ORAL | 0 refills | Status: DC | PRN

## 2023-04-09 NOTE — Patient Instructions (Signed)

## 2023-04-09 NOTE — Progress Notes (Signed)
Return patient Visit  Assessment: [name] is a 67 y.o. male with the following: 1. Acute pain of right shoulder  Plan: [name] sustained a fall, landing on his right shoulder approximately a few weeks ago.  His pain has improved, although he still has very limited function.  He was unable to complete the course of prednisone, but feels the medication may have helped a little bit.  He has little interest in surgery, so I would recommend against an MRI at this time.  He is interested in an injection if this could be helpful.  This was completed in clinic today.  He will return to clinic in approximately 1 month.  Procedure note injection - Right shoulder    Verbal consent was obtained to inject the right shoulder, subacromial space Timeout was completed to confirm the site of injection.   The skin was prepped with alcohol and ethyl chloride was sprayed at the injection site.  A 21-gauge needle was used to inject 40 mg of Depo-Medrol and 1% lidocaine (4 cc) into the subacromial space of the right shoulder using a posterolateral approach.  There were no complications.  A sterile bandage was applied.   Follow-up: Return in about 4 weeks (around 05/07/2023).  Subjective:  Chief Complaint  Patient presents with   Shoulder Pain    R shoulder still can't move it and was unable to complete prednisone because it made him very nauseated.   NDC: [address]-4    History of Present Illness: [name] is a 67 y.o. male who returns for evaluation of right shoulder pain.  Approximately 3 weeks ago, he states he fell.  He had immediate shoulder pain.  Since then, the pain is worsened.  I saw him in clinic a couple of weeks ago, and he started taking a prednisone Dosepak.  He states he was unable to complete the course of prednisone, due to nausea.  He continues to take pain medications.  He notes some improvements in his pain and motion, but still has a lot of discomfort.  Review of  Systems: No fevers or chills No numbness or tingling No chest pain No shortness of breath No bowel or bladder dysfunction No GI distress No headaches    Objective: There were no vitals taken for this visit.  Physical Exam:  General: Alert and oriented. and No acute distress. Gait: Normal gait.  Right shoulder without bruising.  Mild swelling.  No obvious deformity.    Sensation intact in the axillary nerve distribution.  Sensation intact throughout the right hand.  2+ radial pulse.   Full motion of the right elbow.  He tolerates passive forward flexion to 160 degrees.  Positive drop arm test.  Tenderness to palpation over the lateral posterior aspect of the right shoulder.  No bruising.  No swelling is appreciated.  IMAGING: I personally ordered and reviewed the following images No new imaging obtained today.  New Medications:  Meds ordered this encounter  Medications   methylPREDNISolone acetate (DEPO-MEDROL) injection 40 mg   HYDROcodone-acetaminophen (NORCO/VICODIN) 5-325 MG tablet    Sig: Take 1 tablet by mouth every 6 (six) hours as needed.    Dispense:  20 tablet    Refill:  0      Oliver Barre, MD  04/09/2023 10:24 PM

## 2023-05-01 ENCOUNTER — Telehealth: Payer: Self-pay | Admitting: Urology

## 2023-05-01 NOTE — Telephone Encounter (Signed)
Patient wife called patient has blood in urine again,  offered patient wife a appointment for him today with Maralyn Sago , she refused.  Scheduled him for the next available with Dr Ronne Binning for 07/02/22

## 2023-05-03 ENCOUNTER — Emergency Department (HOSPITAL_COMMUNITY)
Admission: EM | Admit: 2023-05-03 | Discharge: 2023-05-04 | Discharge status: Against Medical Advice | Payer: HMO | Attending: Emergency Medicine | Admitting: Emergency Medicine

## 2023-05-03 ENCOUNTER — Other Ambulatory Visit: Payer: Self-pay

## 2023-05-03 DIAGNOSIS — R39198 Other difficulties with micturition: Secondary | ICD-10-CM | POA: Insufficient documentation

## 2023-05-03 DIAGNOSIS — Z5321 Procedure and treatment not carried out due to patient leaving prior to being seen by health care provider: Secondary | ICD-10-CM | POA: Insufficient documentation

## 2023-05-03 DIAGNOSIS — N4889 Other specified disorders of penis: Secondary | ICD-10-CM | POA: Diagnosis not present

## 2023-05-03 NOTE — Telephone Encounter (Signed)
Dr. Ronne Binning says you can double book him to be seen before the year is out. He wants to see him sooner.

## 2023-05-03 NOTE — ED Triage Notes (Signed)
Pt reports:  Penis Bleeding Started 3 days ago Today worsening Painful on urination Increased difficulty urination

## 2023-05-04 DIAGNOSIS — N390 Urinary tract infection, site not specified: Secondary | ICD-10-CM | POA: Diagnosis not present

## 2023-05-04 DIAGNOSIS — E119 Type 2 diabetes mellitus without complications: Secondary | ICD-10-CM | POA: Diagnosis not present

## 2023-05-04 DIAGNOSIS — R310 Gross hematuria: Secondary | ICD-10-CM | POA: Diagnosis not present

## 2023-05-04 DIAGNOSIS — F17210 Nicotine dependence, cigarettes, uncomplicated: Secondary | ICD-10-CM | POA: Diagnosis not present

## 2023-05-04 LAB — URINALYSIS, ROUTINE W REFLEX MICROSCOPIC: RBC / HPF: >50 RBC/hpf (ref 0–5)

## 2023-05-04 NOTE — ED Notes (Signed)
Pt walked out angry stating, He is tired of waiting for Dr to see him

## 2023-05-07 ENCOUNTER — Ambulatory Visit (INDEPENDENT_AMBULATORY_CARE_PROVIDER_SITE_OTHER): Payer: HMO | Admitting: Orthopedic Surgery

## 2023-05-07 DIAGNOSIS — M25511 Pain in right shoulder: Secondary | ICD-10-CM

## 2023-05-08 ENCOUNTER — Encounter: Payer: Self-pay | Admitting: Orthopedic Surgery

## 2023-05-08 NOTE — Progress Notes (Signed)
Return patient Visit  Assessment: NEILS RAI is a 67 y.o. male with the following: 1. Acute pain of right shoulder  Plan: DANTON GAHAN continues to have some pain in the right shoulder.  Overall, it is better.  Prednisone and injection were effective.  He remains uninterested in surgery.  As such, I would recommend against an MRI at this time.  In addition, he needs to continue with a medical workup, and has he reports that he has had some blood in his urine, as well as some clots.  If he continues to have issues in his right shoulder, we can consider repeat injections.  We can also consider an MRI in the future.  For now, he will return to clinic as needed.   Follow-up: Return if symptoms worsen or fail to improve.  Subjective:  Chief Complaint  Patient presents with   Shoulder Pain    R shoulder     History of Present Illness: RAYNELL ROLOSON is a 67 y.o. male who returns for evaluation of right shoulder pain.  I have seen him several times.  Prednisone improved some of his symptoms.  He had injection at the last visit.  He states that he is improving.  He still has some pain.  He remains uninterested in surgery.  He does not want to obtain MRI.  He states he has recently had some blood in his urine, as well as some clots.  He is being evaluated by the urologist.  He has been to the ED for this.  Review of Systems: No fevers or chills No numbness or tingling No chest pain No shortness of breath No bowel or bladder dysfunction No GI distress No headaches    Objective: There were no vitals taken for this visit.  Physical Exam:  General: Alert and oriented. and No acute distress. Gait: Normal gait.  Right shoulder without bruising.  Mild swelling.  No obvious deformity.    Sensation intact in the axillary nerve distribution.  Sensation intact throughout the right hand.  2+ radial pulse.  He has some discomfort, but he can get to 90 degrees of abduction.  120 degrees of  forward flexion.  IMAGING: I personally ordered and reviewed the following images No new imaging obtained today.  New Medications:  No orders of the defined types were placed in this encounter.     Oliver Barre, MD  05/08/2023 11:26 AM

## 2023-05-10 ENCOUNTER — Ambulatory Visit: Payer: HMO | Admitting: Urology

## 2023-05-10 VITALS — BP 134/67 | HR 52

## 2023-05-10 DIAGNOSIS — R310 Gross hematuria: Secondary | ICD-10-CM | POA: Diagnosis not present

## 2023-05-10 LAB — URINALYSIS, ROUTINE W REFLEX MICROSCOPIC
Bilirubin, UA: NEGATIVE
Ketones, UA: NEGATIVE
Leukocytes,UA: NEGATIVE
Nitrite, UA: NEGATIVE
Specific Gravity, UA: 1.02 (ref 1.005–1.030)
Urobilinogen, Ur: 0.2 mg/dL (ref 0.2–1.0)
pH, UA: 6.5 (ref 5.0–7.5)

## 2023-05-10 LAB — MICROSCOPIC EXAMINATION: Bacteria, UA: NONE SEEN

## 2023-05-10 MED ORDER — FINASTERIDE 5 MG PO TABS: 5.0000 mg | ORAL_TABLET | Freq: Every day | ORAL | 3 refills | Status: DC

## 2023-05-10 NOTE — Progress Notes (Unsigned)
05/10/2023 12:48 PM   [name] 05/28/1956 [id number]  Referring provider: Assunta Found, MD [address] [place],  [place] [address]  Gross hematuria   HPI: [name] is a 67yo here for followup for gross hematuria. He had a fell and 2-3 days after his fall he developed gross hematuria. The hematuria resolved after 4 days. NO worsening LUTS. UA today is normal. He was previously on finasteride but stopped the medication 6 months ago.    PMH: Past Medical History:  Diagnosis Date   CAD (coronary artery disease)    cardiac cath 2010       Diabetes mellitus    Dilatation of aorta (HCC)    Abdominal aortic dopplers on 04/17/2012 showed mild distal abdominal aortic dilatation measuring 3.0x3.2cm   Hyperlipidemia    Hypertension    Obesity     Surgical History: Past Surgical History:  Procedure Laterality Date   CARDIAC CATHETERIZATION     11/16/2008  2.5 x 15 XIENCE V drug-eluting stent   KNEE SURGERY     Right    Home Medications:  Allergies as of 05/10/2023       Reactions   Shellfish-derived Products         Medication List        Accurate as of May 10, 2023 12:48 PM. If you have any questions, ask your nurse or doctor.          STOP taking these medications    Clotrimazole 1 % Lotn   Fish Oil 1000 MG Caps   HYDROcodone-acetaminophen 5-325 MG tablet Commonly known as: NORCO/VICODIN   Janumet 50-1000 MG tablet Generic drug: sitaGLIPtin-metformin   naproxen 375 MG tablet Commonly known as: NAPROSYN       TAKE these medications    amLODipine 2.5 MG tablet Commonly known as: NORVASC Take 1 tablet (2.5 mg total) by mouth daily.   aspirin EC 81 MG tablet Take 1 tablet (81 mg total) by mouth daily. Swallow whole.   cephALEXin 500 MG capsule Commonly known as: KEFLEX Take by mouth.   magnesium oxide 400 (240 Mg) MG tablet Commonly known as: MAG-OX Take 400 mg by mouth daily.   metFORMIN 500 MG tablet Commonly known  as: GLUCOPHAGE Take 500 mg by mouth 2 (two) times daily.   rosuvastatin 20 MG tablet Commonly known as: CRESTOR Take 20 mg by mouth daily.   tadalafil 20 MG tablet Commonly known as: CIALIS Take 1 tablet (20 mg total) by mouth daily as needed.        Allergies:  Allergies  Allergen Reactions   Shellfish-Derived Products     Family History: Family History  Problem Relation Age of Onset   Diabetes Mother    Diabetes Brother    Lung cancer Maternal Grandfather    Diabetes Paternal Grandmother    COPD Paternal Grandfather     Social History:  reports that he has been smoking cigarettes. He has a 80 pack-year smoking history. He has never used smokeless tobacco. He reports that he does not currently use alcohol after a past usage of about 1.0 standard drink of alcohol per week. He reports that he does not use drugs.  ROS: All other review of systems were reviewed and are negative except what is noted above in HPI  Physical Exam: BP 134/67   Pulse (!) 52   Constitutional:  Alert and oriented, No acute distress. HEENT:  AT, moist mucus membranes.  Trachea midline, no masses. Cardiovascular: No clubbing,  cyanosis, or edema. Respiratory: Normal respiratory effort, no increased work of breathing. GI: Abdomen is soft, nontender, nondistended, no abdominal masses GU: No CVA tenderness.  Lymph: No cervical or inguinal lymphadenopathy. Skin: No rashes, bruises or suspicious lesions. Neurologic: Grossly intact, no focal deficits, moving all 4 extremities. Psychiatric: Normal mood and affect.  Laboratory Data: Lab Results  Component Value Date   WBC 11.3 (H) 08/01/2022   HGB 14.5 08/01/2022   HCT 42.4 08/01/2022   MCV 87.8 08/01/2022   PLT 312 08/01/2022    Lab Results  Component Value Date   CREATININE 0.87 08/01/2022    No results Hartman for: "PSA"  No results Hartman for: "TESTOSTERONE"  No results Hartman for: "HGBA1C"  Urinalysis    Component Value Date/Time    COLORURINE RED (A) 05/03/2023 2037   APPEARANCEUR TURBID (A) 05/03/2023 2037   APPEARANCEUR Clear 01/18/2023 1159   LABSPEC  05/03/2023 2037    TEST NOT REPORTED DUE TO COLOR INTERFERENCE OF URINE PIGMENT   PHURINE  05/03/2023 2037    TEST NOT REPORTED DUE TO COLOR INTERFERENCE OF URINE PIGMENT   GLUCOSEU (A) 05/03/2023 2037    TEST NOT REPORTED DUE TO COLOR INTERFERENCE OF URINE PIGMENT   HGBUR (A) 05/03/2023 2037    TEST NOT REPORTED DUE TO COLOR INTERFERENCE OF URINE PIGMENT   BILIRUBINUR (A) 05/03/2023 2037    TEST NOT REPORTED DUE TO COLOR INTERFERENCE OF URINE PIGMENT   BILIRUBINUR Negative 01/18/2023 1159   KETONESUR (A) 05/03/2023 2037    TEST NOT REPORTED DUE TO COLOR INTERFERENCE OF URINE PIGMENT   PROTEINUR (A) 05/03/2023 2037    TEST NOT REPORTED DUE TO COLOR INTERFERENCE OF URINE PIGMENT   UROBILINOGEN 0.2 12/14/2010 2204   NITRITE (A) 05/03/2023 2037    TEST NOT REPORTED DUE TO COLOR INTERFERENCE OF URINE PIGMENT   LEUKOCYTESUR (A) 05/03/2023 2037    TEST NOT REPORTED DUE TO COLOR INTERFERENCE OF URINE PIGMENT    Lab Results  Component Value Date   LABMICR See below: 01/18/2023   WBCUA None seen 01/18/2023   LABEPIT 0-10 01/18/2023   MUCUS Present 10/14/2020   BACTERIA MANY (A) 05/03/2023    Pertinent Imaging:  No results Hartman for this or any previous visit.  No results Hartman for this or any previous visit.  No results Hartman for this or any previous visit.  No results Hartman for this or any previous visit.  No results Hartman for this or any previous visit.  No results Hartman for this or any previous visit.  No results Hartman for this or any previous visit.  Results for orders placed during the hospital encounter of 07/28/20  CT Renal Stone Study  Narrative CLINICAL DATA:  Hematuria  EXAM: CT ABDOMEN AND PELVIS WITHOUT CONTRAST  TECHNIQUE: Multidetector CT imaging of the abdomen and pelvis was performed following the standard protocol without  IV contrast.  COMPARISON:  02/14/2020  FINDINGS: Lower chest: The visualized lung bases are clear. At least moderate right coronary artery calcification. Global cardiac size within normal limits.  Hepatobiliary: Cholelithiasis without pericholecystic inflammatory change. Liver unremarkable. No intra or extrahepatic biliary ductal dilation.  Pancreas: Unremarkable  Spleen: Unremarkable  Adrenals/Urinary Tract: 2 cm adrenal adenoma within the lateral limb and 12 mm adrenal adenoma within the body of the left adrenal gland are again identified and are unchanged. Right adrenal gland is unremarkable. Kidneys are normal in size and position and are unremarkable. Bladder is unremarkable.  Stomach/Bowel: Stomach is within normal  limits. Appendix appears normal. No evidence of bowel wall thickening, distention, or inflammatory changes. No free intraperitoneal gas or fluid  Vascular/Lymphatic: 3.8 cm fusiform infrarenal abdominal aortic aneurysm, stable since prior examination. Moderate superimposed aortoiliac atherosclerotic calcification.  No pathologic adenopathy within the abdomen and pelvis.  Reproductive: Mild prostatic enlargement. Seminal vesicles are unremarkable.  Other: Small fat containing left inguinal hernia. Small fat containing umbilical hernia.  Musculoskeletal: Degenerative changes are seen within the lumbar spine. No lytic or blastic bone lesions are seen.  IMPRESSION: No acute intra-abdominal pathology. No definite radiographic explanation for the patient's reported symptoms.  Cholelithiasis  Multiple left adrenal adenomas, unchanged.  Stable 3.8 cm infrarenal abdominal aortic aneurysm. Recommend follow-up ultrasound every 2 years. This recommendation follows ACR consensus guidelines: White Paper of the ACR Incidental Findings Committee II on Vascular Findings. J Am Coll Radiol 2013; 10:789-794.  Aortic aneurysm NOS (ICD10-I71.9).   Electronically  Signed By: Helyn Numbers MD On: 07/29/2020 01:39   Assessment & Plan:    1. Hematuria, gross (Primary) -We will restart finasteride 5mg  daily - Urinalysis, Routine w reflex microscopic   No follow-ups on file.  Wilkie Aye, MD  [facility] [place]

## 2023-05-14 ENCOUNTER — Encounter: Payer: Self-pay | Admitting: Urology

## 2023-05-14 NOTE — Patient Instructions (Signed)

## 2023-05-16 DIAGNOSIS — Z1331 Encounter for screening for depression: Secondary | ICD-10-CM | POA: Diagnosis not present

## 2023-05-16 DIAGNOSIS — Z0001 Encounter for general adult medical examination with abnormal findings: Secondary | ICD-10-CM | POA: Diagnosis not present

## 2023-05-16 DIAGNOSIS — I2510 Atherosclerotic heart disease of native coronary artery without angina pectoris: Secondary | ICD-10-CM | POA: Diagnosis not present

## 2023-05-16 DIAGNOSIS — I739 Peripheral vascular disease, unspecified: Secondary | ICD-10-CM | POA: Diagnosis not present

## 2023-05-16 DIAGNOSIS — E782 Mixed hyperlipidemia: Secondary | ICD-10-CM | POA: Diagnosis not present

## 2023-05-16 DIAGNOSIS — E1140 Type 2 diabetes mellitus with diabetic neuropathy, unspecified: Secondary | ICD-10-CM | POA: Diagnosis not present

## 2023-05-16 DIAGNOSIS — E663 Overweight: Secondary | ICD-10-CM | POA: Diagnosis not present

## 2023-05-16 DIAGNOSIS — Z6829 Body mass index (BMI) 29.0-29.9, adult: Secondary | ICD-10-CM | POA: Diagnosis not present

## 2023-05-16 DIAGNOSIS — E1165 Type 2 diabetes mellitus with hyperglycemia: Secondary | ICD-10-CM | POA: Diagnosis not present

## 2023-05-28 DIAGNOSIS — I2510 Atherosclerotic heart disease of native coronary artery without angina pectoris: Secondary | ICD-10-CM | POA: Diagnosis not present

## 2023-05-28 DIAGNOSIS — E1140 Type 2 diabetes mellitus with diabetic neuropathy, unspecified: Secondary | ICD-10-CM | POA: Diagnosis not present

## 2023-06-04 ENCOUNTER — Encounter: Payer: Self-pay | Admitting: Orthopedic Surgery

## 2023-06-04 ENCOUNTER — Ambulatory Visit: Payer: HMO | Admitting: Orthopedic Surgery

## 2023-06-04 VITALS — BP 145/81 | HR 60

## 2023-06-04 DIAGNOSIS — M25511 Pain in right shoulder: Secondary | ICD-10-CM

## 2023-06-04 MED ORDER — CYCLOBENZAPRINE HCL 10 MG PO TABS: 10.0000 mg | ORAL_TABLET | Freq: Two times a day (BID) | ORAL | 0 refills | Status: DC | PRN

## 2023-06-04 MED ORDER — PREDNISONE 10 MG (21) PO TBPK: ORAL_TABLET | ORAL | 0 refills | Status: DC
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
## 2023-06-06 ENCOUNTER — Ambulatory Visit: Payer: HMO | Admitting: Urology

## 2023-07-03 ENCOUNTER — Ambulatory Visit: Payer: HMO | Admitting: Urology

## 2023-07-22 DIAGNOSIS — I739 Peripheral vascular disease, unspecified: Secondary | ICD-10-CM | POA: Diagnosis not present

## 2023-07-22 DIAGNOSIS — I2510 Atherosclerotic heart disease of native coronary artery without angina pectoris: Secondary | ICD-10-CM | POA: Diagnosis not present

## 2023-07-22 DIAGNOSIS — L0212 Furuncle of neck: Secondary | ICD-10-CM | POA: Diagnosis not present

## 2023-07-22 DIAGNOSIS — Z6828 Body mass index (BMI) 28.0-28.9, adult: Secondary | ICD-10-CM | POA: Diagnosis not present

## 2023-07-22 DIAGNOSIS — I700 Atherosclerosis of aorta: Secondary | ICD-10-CM | POA: Diagnosis not present

## 2023-07-22 DIAGNOSIS — M549 Dorsalgia, unspecified: Secondary | ICD-10-CM | POA: Diagnosis not present

## 2023-07-22 DIAGNOSIS — I714 Abdominal aortic aneurysm, without rupture, unspecified: Secondary | ICD-10-CM | POA: Diagnosis not present

## 2023-07-22 DIAGNOSIS — E1140 Type 2 diabetes mellitus with diabetic neuropathy, unspecified: Secondary | ICD-10-CM | POA: Diagnosis not present

## 2023-10-14 DIAGNOSIS — R030 Elevated blood-pressure reading, without diagnosis of hypertension: Secondary | ICD-10-CM | POA: Diagnosis not present

## 2023-10-14 DIAGNOSIS — S61411A Laceration without foreign body of right hand, initial encounter: Secondary | ICD-10-CM | POA: Diagnosis not present

## 2023-11-08 ENCOUNTER — Encounter: Payer: Self-pay | Admitting: Urology

## 2023-11-08 ENCOUNTER — Ambulatory Visit: Payer: HMO | Admitting: Urology

## 2023-11-08 VITALS — BP 134/68 | HR 52

## 2023-11-08 DIAGNOSIS — R310 Gross hematuria: Secondary | ICD-10-CM | POA: Diagnosis not present

## 2023-11-08 DIAGNOSIS — N5201 Erectile dysfunction due to arterial insufficiency: Secondary | ICD-10-CM

## 2023-11-08 DIAGNOSIS — R3912 Poor urinary stream: Secondary | ICD-10-CM

## 2023-11-08 DIAGNOSIS — N401 Enlarged prostate with lower urinary tract symptoms: Secondary | ICD-10-CM | POA: Diagnosis not present

## 2023-11-08 DIAGNOSIS — N138 Other obstructive and reflux uropathy: Secondary | ICD-10-CM

## 2023-11-08 LAB — URINALYSIS, ROUTINE W REFLEX MICROSCOPIC
Bilirubin, UA: NEGATIVE
Ketones, UA: NEGATIVE
Leukocytes,UA: NEGATIVE
Nitrite, UA: NEGATIVE
RBC, UA: NEGATIVE
Specific Gravity, UA: 1.02 (ref 1.005–1.030)
Urobilinogen, Ur: 0.2 mg/dL (ref 0.2–1.0)
pH, UA: 6 (ref 5.0–7.5)

## 2023-11-08 LAB — MICROSCOPIC EXAMINATION
Bacteria, UA: NONE SEEN
RBC, Urine: NONE SEEN /HPF (ref 0–2)
WBC, UA: NONE SEEN /HPF (ref 0–5)

## 2023-11-08 MED ORDER — FINASTERIDE 5 MG PO TABS: 5.0000 mg | ORAL_TABLET | Freq: Every day | ORAL | 3 refills | Status: DC

## 2023-11-08 MED ORDER — ALFUZOSIN HCL ER 10 MG PO TB24: 10.0000 mg | ORAL_TABLET | Freq: Every day | ORAL | 11 refills | Status: DC

## 2023-11-08 NOTE — Patient Instructions (Signed)

## 2023-11-08 NOTE — Progress Notes (Signed)
 11/08/2023 10:35 AM   [name] 12-25-1955 [id number]  Referring provider: Minus Amel, MD [address] [place],  [place] [address]  Followup gross hematuria  HPI: Mr [name] is a 68yo here for followup for gross hematuria and erectile dysfunction. NO gross hematuria since last visit. IPSS 26 QOL 4 on  finasteride  5mg  daily. Urine stream is weak. Nocturia 2x. He has straining to urinate. He tried tadalafil  which failed to give him a firm erection.    PMH: Past Medical History:  Diagnosis Date   CAD (coronary artery disease)    cardiac cath 2010       Diabetes mellitus    Dilatation of aorta (HCC)    Abdominal aortic dopplers on 04/17/2012 showed mild distal abdominal aortic dilatation measuring 3.0x3.2cm   Hyperlipidemia    Hypertension    Obesity     Surgical History: Past Surgical History:  Procedure Laterality Date   CARDIAC CATHETERIZATION     11/16/2008  2.5 x 15 XIENCE V drug-eluting stent   KNEE SURGERY     Right    Home Medications:  Allergies as of 11/08/2023       Reactions   Shellfish-derived Products         Medication List        Accurate as of November 08, 2023 10:35 AM. If you have any questions, ask your nurse or doctor.          amLODipine  2.5 MG tablet Commonly known as: NORVASC  Take 1 tablet (2.5 mg total) by mouth daily.   aspirin  EC 81 MG tablet Take 1 tablet (81 mg total) by mouth daily. Swallow whole.   cyclobenzaprine  10 MG tablet Commonly known as: FLEXERIL  Take 1 tablet (10 mg total) by mouth 2 (two) times daily as needed.   finasteride  5 MG tablet Commonly known as: PROSCAR  Take 1 tablet (5 mg total) by mouth daily.   magnesium oxide 400 (240 Mg) MG tablet Commonly known as: MAG-OX Take 400 mg by mouth daily.   metFORMIN 500 MG tablet Commonly known as: GLUCOPHAGE Take 500 mg by mouth 2 (two) times daily.   predniSONE  10 MG (21) Tbpk tablet Commonly known as: STERAPRED UNI-PAK 21 TAB 10 mg DS 12 as  directed   rosuvastatin  20 MG tablet Commonly known as: CRESTOR  Take 20 mg by mouth daily.   tadalafil  20 MG tablet Commonly known as: CIALIS  Take 1 tablet (20 mg total) by mouth daily as needed.        Allergies:  Allergies  Allergen Reactions   Shellfish-Derived Products     Family History: Family History  Problem Relation Age of Onset   Diabetes Mother    Diabetes Brother    Lung cancer Maternal Grandfather    Diabetes Paternal Grandmother    COPD Paternal Grandfather     Social History:  reports that he has been smoking cigarettes. He has a 80 pack-year smoking history. He has never used smokeless tobacco. He reports that he does not currently use alcohol after a past usage of about 1.0 standard drink of alcohol per week. He reports that he does not use drugs.  ROS: All other review of systems were reviewed and are negative except what is noted above in HPI  Physical Exam: BP 134/68   Pulse (!) 52   Constitutional:  Alert and oriented, No acute distress. HEENT: [place] AT, moist mucus membranes.  Trachea midline, no masses. Cardiovascular: No clubbing, cyanosis, or edema. Respiratory: Normal respiratory effort,  no increased work of breathing. GI: Abdomen is soft, nontender, nondistended, no abdominal masses GU: No CVA tenderness.  Lymph: No cervical or inguinal lymphadenopathy. Skin: No rashes, bruises or suspicious lesions. Neurologic: Grossly intact, no focal deficits, moving all 4 extremities. Psychiatric: Normal mood and affect.  Laboratory Data: Lab Results  Component Value Date   WBC 11.3 (H) 08/01/2022   HGB 14.5 08/01/2022   HCT 42.4 08/01/2022   MCV 87.8 08/01/2022   PLT 312 08/01/2022    Lab Results  Component Value Date   CREATININE 0.87 08/01/2022    No results found for: PSA  No results found for: TESTOSTERONE  No results found for: HGBA1C  Urinalysis    Component Value Date/Time   COLORURINE RED (A) 05/03/2023 2037    APPEARANCEUR Clear 05/10/2023 1220   LABSPEC  05/03/2023 2037    TEST NOT REPORTED DUE TO COLOR INTERFERENCE OF URINE PIGMENT   PHURINE  05/03/2023 2037    TEST NOT REPORTED DUE TO COLOR INTERFERENCE OF URINE PIGMENT   GLUCOSEU 3+ (A) 05/10/2023 1220   HGBUR (A) 05/03/2023 2037    TEST NOT REPORTED DUE TO COLOR INTERFERENCE OF URINE PIGMENT   BILIRUBINUR Negative 05/10/2023 1220   KETONESUR (A) 05/03/2023 2037    TEST NOT REPORTED DUE TO COLOR INTERFERENCE OF URINE PIGMENT   PROTEINUR Trace 05/10/2023 1220   PROTEINUR (A) 05/03/2023 2037    TEST NOT REPORTED DUE TO COLOR INTERFERENCE OF URINE PIGMENT   UROBILINOGEN 0.2 12/14/2010 2204   NITRITE Negative 05/10/2023 1220   NITRITE (A) 05/03/2023 2037    TEST NOT REPORTED DUE TO COLOR INTERFERENCE OF URINE PIGMENT   LEUKOCYTESUR Negative 05/10/2023 1220   LEUKOCYTESUR (A) 05/03/2023 2037    TEST NOT REPORTED DUE TO COLOR INTERFERENCE OF URINE PIGMENT    Lab Results  Component Value Date   LABMICR See below: 05/10/2023   WBCUA 0-5 05/10/2023   LABEPIT 0-10 05/10/2023   MUCUS Present 10/14/2020   BACTERIA None seen 05/10/2023    Pertinent Imaging:  No results found for this or any previous visit.  No results found for this or any previous visit.  No results found for this or any previous visit.  No results found for this or any previous visit.  No results found for this or any previous visit.  No results found for this or any previous visit.  No results found for this or any previous visit.  Results for orders placed during the hospital encounter of 07/28/20  CT Renal Stone Study  Narrative CLINICAL DATA:  Hematuria  EXAM: CT ABDOMEN AND PELVIS WITHOUT CONTRAST  TECHNIQUE: Multidetector CT imaging of the abdomen and pelvis was performed following the standard protocol without IV contrast.  COMPARISON:  02/14/2020  FINDINGS: Lower chest: The visualized lung bases are clear. At least moderate right coronary  artery calcification. Global cardiac size within normal limits.  Hepatobiliary: Cholelithiasis without pericholecystic inflammatory change. Liver unremarkable. No intra or extrahepatic biliary ductal dilation.  Pancreas: Unremarkable  Spleen: Unremarkable  Adrenals/Urinary Tract: 2 cm adrenal adenoma within the lateral limb and 12 mm adrenal adenoma within the body of the left adrenal gland are again identified and are unchanged. Right adrenal gland is unremarkable. Kidneys are normal in size and position and are unremarkable. Bladder is unremarkable.  Stomach/Bowel: Stomach is within normal limits. Appendix appears normal. No evidence of bowel wall thickening, distention, or inflammatory changes. No free intraperitoneal gas or fluid  Vascular/Lymphatic: 3.8 cm fusiform infrarenal abdominal aortic  aneurysm, stable since prior examination. Moderate superimposed aortoiliac atherosclerotic calcification.  No pathologic adenopathy within the abdomen and pelvis.  Reproductive: Mild prostatic enlargement. Seminal vesicles are unremarkable.  Other: Small fat containing left inguinal hernia. Small fat containing umbilical hernia.  Musculoskeletal: Degenerative changes are seen within the lumbar spine. No lytic or blastic bone lesions are seen.  IMPRESSION: No acute intra-abdominal pathology. No definite radiographic explanation for the patient's reported symptoms.  Cholelithiasis  Multiple left adrenal adenomas, unchanged.  Stable 3.8 cm infrarenal abdominal aortic aneurysm. Recommend follow-up ultrasound every 2 years. This recommendation follows ACR consensus guidelines: White Paper of the ACR Incidental Findings Committee II on Vascular Findings. J Am Coll Radiol 2013; 10:789-794.  Aortic aneurysm NOS (ICD10-I71.9).   Electronically Signed By: Worthy Heads MD On: 07/29/2020 01:39   Assessment & Plan:    1. Hematuria, gross (Primary) Continue finasteride  5mg    - Urinalysis, Routine w reflex microscopic  2. Erectile dysfunction due to arterial insufficiency -patient defers therapy at this time  3. BPh with weak urinary stream We will trial uroxatral 10mg  daily   No follow-ups on file.  Johnie Nailer, MD  [facility] [place]

## 2023-12-13 ENCOUNTER — Ambulatory Visit: Admitting: Internal Medicine

## 2024-01-14 DIAGNOSIS — T63441A Toxic effect of venom of bees, accidental (unintentional), initial encounter: Secondary | ICD-10-CM | POA: Diagnosis not present

## 2024-01-14 DIAGNOSIS — R030 Elevated blood-pressure reading, without diagnosis of hypertension: Secondary | ICD-10-CM | POA: Diagnosis not present

## 2024-01-14 DIAGNOSIS — L500 Allergic urticaria: Secondary | ICD-10-CM | POA: Diagnosis not present

## 2024-01-20 ENCOUNTER — Ambulatory Visit: Payer: HMO | Admitting: Urology

## 2024-02-19 ENCOUNTER — Emergency Department (HOSPITAL_COMMUNITY)
Admission: EM | Admit: 2024-02-19 | Discharge: 2024-02-19 | Disposition: A | Discharge status: Home / Self Care | Attending: Emergency Medicine | Admitting: Emergency Medicine

## 2024-02-19 ENCOUNTER — Encounter (HOSPITAL_COMMUNITY): Payer: Self-pay | Admitting: *Deleted

## 2024-02-19 ENCOUNTER — Other Ambulatory Visit: Payer: Self-pay

## 2024-02-19 DIAGNOSIS — Z7984 Long term (current) use of oral hypoglycemic drugs: Secondary | ICD-10-CM | POA: Insufficient documentation

## 2024-02-19 DIAGNOSIS — E119 Type 2 diabetes mellitus without complications: Secondary | ICD-10-CM | POA: Insufficient documentation

## 2024-02-19 DIAGNOSIS — S30861A Insect bite (nonvenomous) of abdominal wall, initial encounter: Secondary | ICD-10-CM | POA: Diagnosis not present

## 2024-02-19 DIAGNOSIS — Z7982 Long term (current) use of aspirin: Secondary | ICD-10-CM | POA: Diagnosis not present

## 2024-02-19 DIAGNOSIS — I10 Essential (primary) hypertension: Secondary | ICD-10-CM | POA: Diagnosis not present

## 2024-02-19 DIAGNOSIS — Z79899 Other long term (current) drug therapy: Secondary | ICD-10-CM | POA: Insufficient documentation

## 2024-02-19 DIAGNOSIS — I2510 Atherosclerotic heart disease of native coronary artery without angina pectoris: Secondary | ICD-10-CM | POA: Insufficient documentation

## 2024-02-19 DIAGNOSIS — W57XXXA Bitten or stung by nonvenomous insect and other nonvenomous arthropods, initial encounter: Secondary | ICD-10-CM | POA: Diagnosis not present

## 2024-02-19 LAB — CBC
HCT: 41.1 % (ref 39.0–52.0)
Hemoglobin: 13.7 g/dL (ref 13.0–17.0)
MCH: 28.7 pg (ref 26.0–34.0)
MCHC: 33.3 g/dL (ref 30.0–36.0)
MCV: 86.2 fL (ref 80.0–100.0)
Platelets: 308 K/uL (ref 150–400)
RBC: 4.77 MIL/uL (ref 4.22–5.81)
RDW: 14.5 % (ref 11.5–15.5)
WBC: 8.3 K/uL (ref 4.0–10.5)
nRBC: 0 % (ref 0.0–0.2)

## 2024-02-19 LAB — URINALYSIS, ROUTINE W REFLEX MICROSCOPIC
Bacteria, UA: NONE SEEN
Bilirubin Urine: NEGATIVE
Glucose, UA: >=500 mg/dL — AB
Hgb urine dipstick: NEGATIVE
Ketones, ur: NEGATIVE mg/dL
Leukocytes,Ua: NEGATIVE
Nitrite: NEGATIVE
Protein, ur: NEGATIVE mg/dL
Specific Gravity, Urine: 1.005 (ref 1.005–1.030)
pH: 6 (ref 5.0–8.0)

## 2024-02-19 LAB — CK: Total CK: 81 U/L (ref 49–397)

## 2024-02-19 LAB — CBG MONITORING, ED: Glucose-Capillary: 190 mg/dL — ABNORMAL HIGH (ref 70–99)

## 2024-02-19 MED ORDER — GABAPENTIN 300 MG PO CAPS
300.0000 mg | ORAL_CAPSULE | Freq: Once | ORAL | Status: AC
  Administered 2024-02-19: 300 mg via ORAL
  Filled 2024-02-19: qty 1

## 2024-02-19 MED ORDER — GABAPENTIN 300 MG PO CAPS: 300.0000 mg | ORAL_CAPSULE | Freq: Two times a day (BID) | ORAL | 0 refills | Status: DC

## 2024-02-19 NOTE — Discharge Instructions (Signed)
 Your lab test and exam today are reassuring, we are treating you for suspected nerve irritation triggered by this bee sting.  Gabapentin  is a medication that can help you with this, start taking this tomorrow as you have had today's dose here.  I do recommend follow-up with your primary provider if your symptoms are not improving with this treatment plan.

## 2024-02-19 NOTE — ED Triage Notes (Signed)
 Pt states he was stung by a bee to his right abdomen a few weeks ago but pt is still having pain; pt states at times the pain is intense even when a shirt is touching the skin  No redness or rash noticed at this time

## 2024-02-19 NOTE — ED Provider Notes (Signed)
 [place] EMERGENCY DEPARTMENT AT [facility] Provider Note   CSN: [id number] Arrival date & time: 02/19/24  [id number]     Patient presents with: Insect Bite   [name] is a 68 y.o. male with a history of type 2 diabetes, hypertension, hyperlipidemia, CAD presenting for evaluation of persistent abdominal wall/burning skin pain at the site of a yellowjacket sting which occurred 6 to 7 weeks ago.  He states initially the wound site was very slow to heal, it took several days for him to get this stinger out, then the site later developed a plug of dark material which he then removed which left him with a hole in his skin.  His PCP treated him with antihistamines and topical antibiotics and the wound has completely healed but he continues to have burning pain around the site which now radiates into his right flank.  He denies redness, blisters or ongoing rash or other visible skin changes.  He has found no alleviators for symptoms.  Pt also mentions he has had burning pain with urination for the last several days.  Denies hematuria, fevers or chills, he does endorse urinary hesitancy at baseline which is not new.    The history is provided by the patient.       Prior to Admission medications   Medication Sig Start Date End Date Taking? Authorizing Provider  gabapentin  (NEURONTIN ) 300 MG capsule Take 1 capsule (300 mg total) by mouth 2 (two) times daily for 14 days. 02/19/24 03/04/24 Yes Yaqub Arney, PA-C  alfuzosin  (UROXATRAL ) 10 MG 24 hr tablet Take 1 tablet (10 mg total) by mouth at bedtime. 11/08/23   McKenzie, Belvie LITTIE, MD  amLODipine  (NORVASC ) 2.5 MG tablet Take 1 tablet (2.5 mg total) by mouth daily. 03/28/23   Mallipeddi, Vishnu P, MD  aspirin  EC 81 MG tablet Take 1 tablet (81 mg total) by mouth daily. Swallow whole. 11/28/22   Mallipeddi, Vishnu P, MD  cyclobenzaprine  (FLEXERIL ) 10 MG tablet Take 1 tablet (10 mg total) by mouth 2 (two) times daily as needed. 06/04/23   Onesimo Oneil LABOR, MD  finasteride  (PROSCAR ) 5 MG tablet Take 1 tablet (5 mg total) by mouth daily. 11/08/23   McKenzie, Belvie LITTIE, MD  magnesium oxide (MAG-OX) 400 (240 Mg) MG tablet Take 400 mg by mouth daily.    [provider]  metFORMIN (GLUCOPHAGE) 500 MG tablet Take 500 mg by mouth 2 (two) times daily. 01/30/23   [provider]  predniSONE  (STERAPRED UNI-PAK 21 TAB) 10 MG (21) TBPK tablet 10 mg DS 12 as directed 06/04/23   Onesimo Oneil LABOR, MD  rosuvastatin  (CRESTOR ) 20 MG tablet Take 20 mg by mouth daily. 09/04/22   [provider]  tadalafil  (CIALIS ) 20 MG tablet Take 1 tablet (20 mg total) by mouth daily as needed. 09/28/22   McKenzie, Belvie LITTIE, MD    Allergies: Shellfish-derived products    Review of Systems  Constitutional:  Negative for fever.  HENT:  Negative for congestion and sore throat.   Eyes: Negative.   Respiratory:  Negative for chest tightness and shortness of breath.   Cardiovascular:  Negative for chest pain.  Gastrointestinal:  Negative for abdominal pain and nausea.  Genitourinary:  Positive for dysuria.  Musculoskeletal:  Negative for arthralgias, joint swelling and neck pain.  Skin: Negative.  Negative for rash and wound.       Negative except as mentioned in HPI.    Neurological:  Negative for dizziness, weakness,  light-headedness, numbness and headaches.  Psychiatric/Behavioral: Negative.      Updated Vital Signs BP (!) 140/56   Pulse (!) 55   Temp 97.8 F (36.6 C) (Oral)   Resp 18   Ht 6' (1.829 m)   Wt 99.8 kg   SpO2 99%   BMI 29.84 kg/m   Physical Exam Vitals and nursing note reviewed.  Constitutional:      Appearance: He is well-developed.  HENT:     Head: Normocephalic and atraumatic.  Eyes:     Conjunctiva/sclera: Conjunctivae normal.  Cardiovascular:     Rate and Rhythm: Normal rate.  Pulmonary:     Effort: Pulmonary effort is normal.  Abdominal:     General: Bowel sounds are normal.     Palpations: Abdomen is soft.      Tenderness: There is no abdominal tenderness. There is no right CVA tenderness, left CVA tenderness or guarding.  Musculoskeletal:        General: Normal range of motion.     Cervical back: Normal range of motion.  Skin:    General: Skin is warm and dry.     Findings: No erythema, lesion or rash.     Comments: Mild tenderness to palpation along right mid abdominal skin, there is no erythema, rash.  There is a small well-healed scar at the site of the insect bite.  No palpable foreign body.  Neurological:     General: No focal deficit present.     Mental Status: He is alert.     (all labs ordered are listed, but only abnormal results are displayed) Labs Reviewed  URINALYSIS, ROUTINE W REFLEX MICROSCOPIC - Abnormal; Notable for the following components:      Result Value   Color, Urine STRAW (*)    Glucose, UA >=500 (*)    All other components within normal limits  CBG MONITORING, ED - Abnormal; Notable for the following components:   Glucose-Capillary 190 (*)    All other components within normal limits  CK  CBC    EKG: None  Radiology: No results found.   Procedures   Medications Ordered in the ED  gabapentin  (NEURONTIN ) capsule 300 mg (300 mg Oral Given 02/19/24 1229)                                    Medical Decision Making Patient presenting with persistent burning quality pain to his abdominal wall originating at the site of a insect sting approximately 7 weeks ago.  Pain has been persistent, his exam is reassuring with no rash, specifically no vesicles, no erythema, small well-healed scar right periumbilical area.  He has no pain with deep abdominal palpation.  I suspect this is neuropathic pain given burning quality.  He has no CVA pain.  Neurontin  has been prescribed, advise close follow-up with his primary provider if symptoms are not improving with this treatment plan.  Amount and/or Complexity of Data Reviewed Labs: ordered.    Details: Labs reviewed  including normal urinalysis but greater than 500 glucose, his CBG today is 190, CBC normal including a normal WBC count at 8.3.  His total CK is 81.  Risk Prescription drug management.        Final diagnoses:  Insect bite of abdominal wall, initial encounter    ED Discharge Orders          Ordered    gabapentin  (NEURONTIN ) 300 MG capsule  2 times daily        02/19/24 1219               Birdena Clarity, DEVONNA 02/22/24 1114    Towana Ozell BROCKS, MD 02/24/24 1136

## 2024-03-11 ENCOUNTER — Ambulatory Visit: Admitting: Urology

## 2024-03-11 VITALS — BP 133/71 | HR 51

## 2024-03-11 DIAGNOSIS — N529 Male erectile dysfunction, unspecified: Secondary | ICD-10-CM | POA: Diagnosis not present

## 2024-03-11 DIAGNOSIS — N5201 Erectile dysfunction due to arterial insufficiency: Secondary | ICD-10-CM

## 2024-03-11 DIAGNOSIS — N401 Enlarged prostate with lower urinary tract symptoms: Secondary | ICD-10-CM | POA: Diagnosis not present

## 2024-03-11 DIAGNOSIS — N138 Other obstructive and reflux uropathy: Secondary | ICD-10-CM

## 2024-03-11 DIAGNOSIS — R310 Gross hematuria: Secondary | ICD-10-CM | POA: Diagnosis not present

## 2024-03-11 DIAGNOSIS — R3912 Poor urinary stream: Secondary | ICD-10-CM

## 2024-03-11 LAB — URINALYSIS, ROUTINE W REFLEX MICROSCOPIC
Bilirubin, UA: NEGATIVE
Ketones, UA: NEGATIVE
Leukocytes,UA: NEGATIVE
Nitrite, UA: NEGATIVE
RBC, UA: NEGATIVE
Specific Gravity, UA: 1.02 (ref 1.005–1.030)
Urobilinogen, Ur: 1 mg/dL (ref 0.2–1.0)
pH, UA: 6 (ref 5.0–7.5)

## 2024-03-11 LAB — MICROSCOPIC EXAMINATION: Bacteria, UA: NONE SEEN

## 2024-03-11 MED ORDER — FINASTERIDE 5 MG PO TABS: 5.0000 mg | ORAL_TABLET | Freq: Every day | ORAL | 3 refills | Status: AC

## 2024-03-11 MED ORDER — ALFUZOSIN HCL ER 10 MG PO TB24: 10.0000 mg | ORAL_TABLET | Freq: Every day | ORAL | 11 refills | Status: DC

## 2024-03-11 MED ORDER — ACYCLOVIR 800 MG PO TABS: 800.0000 mg | ORAL_TABLET | Freq: Every day | ORAL | 0 refills | Status: AC

## 2024-03-11 NOTE — Progress Notes (Signed)
 03/11/2024 10:00 AM   Alm LITTIE Dame 28-Feb-1956 [id number]  Referring provider: Marvine Rush, MD [address] [place],  [place] [address]  Followup BPH   HPI: Mr Tinnell is a 68yo here for followup for BPH with weak stream, gross hematuria and erectile dysfunction. No hematuria since last visit. IPSS 19 QOl 1. He has issues with rash on his right abdomen. Uirne stream strong. No straining to urinate. Nocturia 3x.    PMH: Past Medical History:  Diagnosis Date   CAD (coronary artery disease)    cardiac cath 2010       Diabetes mellitus    Dilatation of aorta    Abdominal aortic dopplers on 04/17/2012 showed mild distal abdominal aortic dilatation measuring 3.0x3.2cm   Hyperlipidemia    Hypertension    Obesity     Surgical History: Past Surgical History:  Procedure Laterality Date   CARDIAC CATHETERIZATION     11/16/2008  2.5 x 15 XIENCE V drug-eluting stent   KNEE SURGERY     Right    Home Medications:  Allergies as of 03/11/2024       Reactions   Shellfish Protein-containing Drug Products         Medication List        Accurate as of March 11, 2024 10:00 AM. If you have any questions, ask your nurse or doctor.          alfuzosin  10 MG 24 hr tablet Commonly known as: UROXATRAL  Take 1 tablet (10 mg total) by mouth at bedtime.   amLODipine  2.5 MG tablet Commonly known as: NORVASC  Take 1 tablet (2.5 mg total) by mouth daily.   aspirin  EC 81 MG tablet Take 1 tablet (81 mg total) by mouth daily. Swallow whole.   cyclobenzaprine  10 MG tablet Commonly known as: FLEXERIL  Take 1 tablet (10 mg total) by mouth 2 (two) times daily as needed.   finasteride  5 MG tablet Commonly known as: PROSCAR  Take 1 tablet (5 mg total) by mouth daily.   gabapentin  300 MG capsule Commonly known as: Neurontin  Take 1 capsule (300 mg total) by mouth 2 (two) times daily for 14 days.   magnesium oxide 400 (240 Mg) MG tablet Commonly known as: MAG-OX Take 400 mg by  mouth daily.   metFORMIN 500 MG tablet Commonly known as: GLUCOPHAGE Take 500 mg by mouth 2 (two) times daily.   predniSONE  10 MG (21) Tbpk tablet Commonly known as: STERAPRED UNI-PAK 21 TAB 10 mg DS 12 as directed   rosuvastatin  20 MG tablet Commonly known as: CRESTOR  Take 20 mg by mouth daily.   tadalafil  20 MG tablet Commonly known as: CIALIS  Take 1 tablet (20 mg total) by mouth daily as needed.        Allergies:  Allergies  Allergen Reactions   Shellfish Protein-Containing Drug Products     Family History: Family History  Problem Relation Age of Onset   Diabetes Mother    Diabetes Brother    Lung cancer Maternal Grandfather    Diabetes Paternal Grandmother    COPD Paternal Grandfather     Social History:  reports that he has been smoking cigarettes. He has a 80 pack-year smoking history. He has never used smokeless tobacco. He reports that he does not currently use alcohol after a past usage of about 1.0 standard drink of alcohol per week. He reports that he does not use drugs.  ROS: All other review of systems were reviewed and are negative except  what is noted above in HPI  Physical Exam: BP 133/71   Pulse (!) 51   Constitutional:  Alert and oriented, No acute distress. HEENT: [place] AT, moist mucus membranes.  Trachea midline, no masses. Cardiovascular: No clubbing, cyanosis, or edema. Respiratory: Normal respiratory effort, no increased work of breathing. GI: Abdomen is soft, nontender, nondistended, no abdominal masses GU: No CVA tenderness.  Lymph: No cervical or inguinal lymphadenopathy. Skin: No rashes, bruises or suspicious lesions. Neurologic: Grossly intact, no focal deficits, moving all 4 extremities. Psychiatric: Normal mood and affect.  Laboratory Data: Lab Results  Component Value Date   WBC 8.3 02/19/2024   HGB 13.7 02/19/2024   HCT 41.1 02/19/2024   MCV 86.2 02/19/2024   PLT 308 02/19/2024    Lab Results  Component Value Date    CREATININE 0.87 08/01/2022    No results found for: PSA  No results found for: TESTOSTERONE  No results found for: HGBA1C  Urinalysis    Component Value Date/Time   COLORURINE STRAW (A) 02/19/2024 1131   APPEARANCEUR CLEAR 02/19/2024 1131   APPEARANCEUR Clear 11/08/2023 1016   LABSPEC 1.005 02/19/2024 1131   PHURINE 6.0 02/19/2024 1131   GLUCOSEU >=500 (A) 02/19/2024 1131   HGBUR NEGATIVE 02/19/2024 1131   BILIRUBINUR NEGATIVE 02/19/2024 1131   BILIRUBINUR Negative 11/08/2023 1016   KETONESUR NEGATIVE 02/19/2024 1131   PROTEINUR NEGATIVE 02/19/2024 1131   UROBILINOGEN 0.2 12/14/2010 2204   NITRITE NEGATIVE 02/19/2024 1131   LEUKOCYTESUR NEGATIVE 02/19/2024 1131    Lab Results  Component Value Date   LABMICR See below: 11/08/2023   WBCUA None seen 11/08/2023   LABEPIT 0-10 11/08/2023   MUCUS Present 10/14/2020   BACTERIA NONE SEEN 02/19/2024    Pertinent Imaging:  No results found for this or any previous visit.  No results found for this or any previous visit.  No results found for this or any previous visit.  No results found for this or any previous visit.  No results found for this or any previous visit.  No results found for this or any previous visit.  No results found for this or any previous visit.  Results for orders placed during the hospital encounter of 07/28/20  CT Renal Stone Study  Narrative CLINICAL DATA:  Hematuria  EXAM: CT ABDOMEN AND PELVIS WITHOUT CONTRAST  TECHNIQUE: Multidetector CT imaging of the abdomen and pelvis was performed following the standard protocol without IV contrast.  COMPARISON:  02/14/2020  FINDINGS: Lower chest: The visualized lung bases are clear. At least moderate right coronary artery calcification. Global cardiac size within normal limits.  Hepatobiliary: Cholelithiasis without pericholecystic inflammatory change. Liver unremarkable. No intra or extrahepatic biliary  ductal dilation.  Pancreas: Unremarkable  Spleen: Unremarkable  Adrenals/Urinary Tract: 2 cm adrenal adenoma within the lateral limb and 12 mm adrenal adenoma within the body of the left adrenal gland are again identified and are unchanged. Right adrenal gland is unremarkable. Kidneys are normal in size and position and are unremarkable. Bladder is unremarkable.  Stomach/Bowel: Stomach is within normal limits. Appendix appears normal. No evidence of bowel wall thickening, distention, or inflammatory changes. No free intraperitoneal gas or fluid  Vascular/Lymphatic: 3.8 cm fusiform infrarenal abdominal aortic aneurysm, stable since prior examination. Moderate superimposed aortoiliac atherosclerotic calcification.  No pathologic adenopathy within the abdomen and pelvis.  Reproductive: Mild prostatic enlargement. Seminal vesicles are unremarkable.  Other: Small fat containing left inguinal hernia. Small fat containing umbilical hernia.  Musculoskeletal: Degenerative changes are seen within the lumbar  spine. No lytic or blastic bone lesions are seen.  IMPRESSION: No acute intra-abdominal pathology. No definite radiographic explanation for the patient's reported symptoms.  Cholelithiasis  Multiple left adrenal adenomas, unchanged.  Stable 3.8 cm infrarenal abdominal aortic aneurysm. Recommend follow-up ultrasound every 2 years. This recommendation follows ACR consensus guidelines: White Paper of the ACR Incidental Findings Committee II on Vascular Findings. J Am Coll Radiol 2013; 10:789-794.  Aortic aneurysm NOS (ICD10-I71.9).   Electronically Signed By: Dorethia Molt MD On: 07/29/2020 01:39   Assessment & Plan:    1. Hematuria, gross (Primary) -continue finasteride  5mg   - Urinalysis, Routine w reflex microscopic  2. Benign prostatic hyperplasia with urinary obstruction Continue uroxatral  10mg    3. Weak urine stream Continue uroxatral  10mg  qhs     No  follow-ups on file.  Belvie Clara, MD  [facility] [place]

## 2024-03-17 ENCOUNTER — Encounter: Payer: Self-pay | Admitting: Urology

## 2024-03-17 NOTE — Patient Instructions (Signed)

## 2024-05-14 ENCOUNTER — Other Ambulatory Visit: Payer: Self-pay | Admitting: Internal Medicine

## 2024-06-02 ENCOUNTER — Ambulatory Visit (INDEPENDENT_AMBULATORY_CARE_PROVIDER_SITE_OTHER): Payer: Self-pay

## 2024-06-02 VITALS — BP 137/77 | HR 70 | Ht 72.0 in | Wt 216.0 lb

## 2024-06-02 DIAGNOSIS — H00014 Hordeolum externum left upper eyelid: Secondary | ICD-10-CM | POA: Diagnosis not present

## 2024-06-02 DIAGNOSIS — E785 Hyperlipidemia, unspecified: Secondary | ICD-10-CM

## 2024-06-02 DIAGNOSIS — I10 Essential (primary) hypertension: Secondary | ICD-10-CM

## 2024-06-02 DIAGNOSIS — F17200 Nicotine dependence, unspecified, uncomplicated: Secondary | ICD-10-CM | POA: Diagnosis not present

## 2024-06-02 DIAGNOSIS — Z1159 Encounter for screening for other viral diseases: Secondary | ICD-10-CM | POA: Diagnosis not present

## 2024-06-02 DIAGNOSIS — K5900 Constipation, unspecified: Secondary | ICD-10-CM | POA: Diagnosis not present

## 2024-06-02 DIAGNOSIS — E1169 Type 2 diabetes mellitus with other specified complication: Secondary | ICD-10-CM

## 2024-06-02 DIAGNOSIS — Z7984 Long term (current) use of oral hypoglycemic drugs: Secondary | ICD-10-CM

## 2024-06-02 DIAGNOSIS — E119 Type 2 diabetes mellitus without complications: Secondary | ICD-10-CM

## 2024-06-02 MED ORDER — BACITRACIN-POLYMYXIN B 500-10000 UNIT/GM OP OINT: 1.0000 | TOPICAL_OINTMENT | Freq: Two times a day (BID) | OPHTHALMIC | 0 refills | Status: AC

## 2024-06-02 MED ORDER — GLIMEPIRIDE 2 MG PO TABS: 2.0000 mg | ORAL_TABLET | Freq: Every day | ORAL | 2 refills | Status: AC

## 2024-06-02 NOTE — Progress Notes (Unsigned)
 "  New Patient Office Visit  Subjective    Patient ID: [name], male    DOB: November 30, 1955  Age: 69 y.o. MRN: [id number]  CC:  Chief Complaint  Patient presents with   Establish Care    Pt here to establish care also states has something on my eye lid and it hurts to touch needs something for it     HPI [name] presents to establish care Discussed the use of AI scribe software for clinical note transcription with the patient, who gave verbal consent to proceed.  History of Present Illness          Outpatient Encounter Medications as of 06/02/2024  Medication Sig   amLODipine  (NORVASC ) 2.5 MG tablet Take 1 tablet by mouth once daily   aspirin  EC 81 MG tablet Take 1 tablet (81 mg total) by mouth daily. Swallow whole.   bacitracin -polymyxin b  (POLYSPORIN ) ophthalmic ointment Place 1 Application into the left eye 2 (two) times daily for 7 days.   finasteride  (PROSCAR ) 5 MG tablet Take 1 tablet (5 mg total) by mouth daily.   magnesium oxide (MAG-OX) 400 (240 Mg) MG tablet Take 400 mg by mouth daily.   metFORMIN (GLUCOPHAGE) 500 MG tablet Take 500 mg by mouth 2 (two) times daily.   rosuvastatin  (CRESTOR ) 20 MG tablet Take 20 mg by mouth daily.   [DISCONTINUED] glimepiride  (AMARYL ) 2 MG tablet Take 2 mg by mouth daily.   glimepiride  (AMARYL ) 2 MG tablet Take 1 tablet (2 mg total) by mouth daily.   [DISCONTINUED] alfuzosin  (UROXATRAL ) 10 MG 24 hr tablet Take 1 tablet (10 mg total) by mouth at bedtime. (Patient not taking: Reported on 06/02/2024)   [DISCONTINUED] cyclobenzaprine  (FLEXERIL ) 10 MG tablet Take 1 tablet (10 mg total) by mouth 2 (two) times daily as needed. (Patient not taking: Reported on 06/02/2024)   [DISCONTINUED] gabapentin  (NEURONTIN ) 300 MG capsule Take 1 capsule (300 mg total) by mouth 2 (two) times daily for 14 days. (Patient not taking: Reported on 06/02/2024)   [DISCONTINUED] predniSONE  (STERAPRED UNI-PAK 21 TAB) 10 MG (21) TBPK tablet 10 mg DS 12 as directed  (Patient not taking: Reported on 06/02/2024)   [DISCONTINUED] tadalafil  (CIALIS ) 20 MG tablet Take 1 tablet (20 mg total) by mouth daily as needed. (Patient not taking: Reported on 06/02/2024)   No facility-administered encounter medications on file as of 06/02/2024.    Past Medical History:  Diagnosis Date   CAD (coronary artery disease)    cardiac cath 2010       Diabetes mellitus    Dilatation of aorta    Abdominal aortic dopplers on 04/17/2012 showed mild distal abdominal aortic dilatation measuring 3.0x3.2cm   Hyperlipidemia    Hypertension    Obesity     Past Surgical History:  Procedure Laterality Date   CARDIAC CATHETERIZATION     11/16/2008  2.5 x 15 XIENCE V drug-eluting stent   KNEE SURGERY     Right    Family History  Problem Relation Age of Onset   Diabetes Mother    Diabetes Brother    Lung cancer Maternal Grandfather    Diabetes Paternal Grandmother    COPD Paternal Grandfather     Social History   Socioeconomic History   Marital status: Married    Spouse name: Not on file   Number of children: 3   Years of education: Not on file   Highest education level: Not on file  Occupational History    Employer:  [employer] EM[employer]OYED  Tobacco Use   Smoking status: Every Day    Current packs/day: 2.00    Average packs/day: 2.0 packs/day for 40.0 years (80.0 ttl pk-yrs)    Types: Cigarettes   Smokeless tobacco: Never  Vaping Use   Vaping status: Never Used  Substance and Sexual Activity   Alcohol use: Not Currently    Alcohol/week: 1.0 standard drink of alcohol    Types: 1 Standard drinks or equivalent per week   Drug use: No   Sexual activity: Not on file    Comment: erectile dysfunction  Other Topics Concern   Not on file  Social History Narrative   Not on file   Social Drivers of Health   Tobacco Use: High Risk (06/02/2024)   Patient History    Smoking Tobacco Use: Every Day    Smokeless Tobacco Use: Never    Passive Exposure: Not on file  Financial  Resource Strain: Not on file  Food Insecurity: No Food Insecurity (06/02/2024)   Epic    Worried About Programme Researcher, Broadcasting/film/video in the Last Year: Never true    [employer] of Food in the Last Year: Never true  Transportation Needs: No Transportation Needs (06/02/2024)   Epic    Lack of Transportation (Medical): No    Lack of Transportation (Non-Medical): No  Physical Activity: Not on file  Stress: Not on file  Social Connections: Not on file  Intimate Partner Violence: Not At Risk (06/02/2024)   Epic    Fear of Current or Ex-Partner: No    Emotionally Abused: No    Physically Abused: No    Sexually Abused: No  Depression (PHQ2-9): Low Risk (06/02/2024)   Depression (PHQ2-9)    PHQ-2 Score: 2  Alcohol Screen: Not on file  Housing: Low Risk (06/02/2024)   Epic    Unable to Pay for Housing in the Last Year: No    Number of Times Moved in the Last Year: 0    Homeless in the Last Year: No  Utilities: Not At Risk (06/02/2024)   Epic    Threatened with loss of utilities: No  Health Literacy: Not on file    ROS      Objective    BP 137/77   Pulse 70   Ht 6' (1.829 m)   Wt 216 lb 0.6 oz (98 kg)   SpO2 94%   BMI 29.30 kg/m   Physical Exam Vitals and nursing note reviewed.  Constitutional:      Appearance: Normal appearance.  HENT:     Head: Normocephalic.     Right Ear: Tympanic membrane, ear canal and external ear normal.     Left Ear: Tympanic membrane, ear canal and external ear normal.     Nose: Nose normal.     Mouth/Throat:     Mouth: Mucous membranes are moist.     Pharynx: Oropharynx is clear.  Eyes:     Extraocular Movements: Extraocular movements intact.     Pupils: Pupils are equal, round, and reactive to light.  Cardiovascular:     Rate and Rhythm: Normal rate and regular rhythm.  Pulmonary:     Effort: Pulmonary effort is normal.     Breath sounds: Normal breath sounds.  Abdominal:     General: Abdomen is flat. Bowel sounds are normal.     Palpations: Abdomen is  soft.  Musculoskeletal:        General: Normal range of motion.     Cervical back: Normal  range of motion and neck supple.  Skin:    General: Skin is warm and dry.  Neurological:     Mental Status: He is alert and oriented to person, place, and time.  Psychiatric:        Mood and Affect: Mood normal.        Thought Content: Thought content normal.     Diabetic foot exam was performed with the following findings:   No deformities, ulcerations, or other skin breakdown Normal sensation of 10g monofilament Intact posterior tibialis and dorsalis pedis pulses         Assessment & Plan:   Problem List Items Addressed This Visit       Cardiovascular and Mediastinum   Essential hypertension - Primary   Stable with current doe of amlodipine  2.5 mg.        Relevant Orders   CMP14+EGFR     Endocrine   Hyperlipidemia associated with type 2 diabetes mellitus (HCC)   Managed with Crestor  20 mg.  Update fasting labs today.         Relevant Medications   glimepiride  (AMARYL ) 2 MG tablet   Other Relevant Orders   CMP14+EGFR   Lipid Profile   Controlled type 2 diabetes mellitus without complication, without long-term current use of insulin (HCC)   - Refilled glimepiride  2 mg prescription. - Ordered blood work.      Relevant Medications   glimepiride  (AMARYL ) 2 MG tablet   Other Relevant Orders   CMP14+EGFR   HgB A1c   Urine Microalbumin w/creat. ratio   HM Diabetes Foot Exam (Completed)     Other   Tobacco dependence (Chronic)   Referral placed for lung cancer screening.       Relevant Orders   Ambulatory Referral Lung Cancer Screening [facility]   Hordeolum externum of left upper eyelid   Add antibiotic eye ointment.  Recommend warm compresses for symptom relief.       Relevant Medications   bacitracin -polymyxin b  (POLYSPORIN ) ophthalmic ointment   Other Visit Diagnoses       Need for hepatitis C screening test       Relevant Orders   Hepatitis C  Antibody     Constipation, unspecified constipation type       - Recommended daily stool softener such as Colace. - Advised use of Miralax as needed. - Encouraged increased water intake.      Return in about 6 months (around 11/30/2024) for chronic follow-up with PCP.   Leita Longs, FNP   "

## 2024-06-03 DIAGNOSIS — E119 Type 2 diabetes mellitus without complications: Secondary | ICD-10-CM | POA: Insufficient documentation

## 2024-06-03 DIAGNOSIS — H00014 Hordeolum externum left upper eyelid: Secondary | ICD-10-CM | POA: Insufficient documentation

## 2024-06-03 NOTE — Assessment & Plan Note (Signed)
 Add antibiotic eye ointment.  Recommend warm compresses for symptom relief.

## 2024-06-03 NOTE — Assessment & Plan Note (Signed)
 Stable with current doe of amlodipine  2.5 mg.

## 2024-06-03 NOTE — Assessment & Plan Note (Signed)
 Managed with Crestor  20 mg.  Update fasting labs today.

## 2024-06-03 NOTE — Assessment & Plan Note (Signed)
-   Refilled glimepiride  2 mg prescription. - Ordered blood work.

## 2024-06-03 NOTE — Assessment & Plan Note (Signed)
Referral placed for lung cancer screening.  

## 2024-06-08 NOTE — Progress Notes (Signed)
 [name]                                          MRN: [id number]   06/08/2024   The [facility] Quality Team Specialist reviewed this patient medical record for the purposes of chart review for care gap closure. The following were reviewed: chart review for care gap closure-kidney health evaluation for diabetes:eGFR  and uACR.    [facility] Quality Team

## 2024-06-10 ENCOUNTER — Other Ambulatory Visit: Payer: Self-pay | Admitting: Internal Medicine

## 2024-06-19 LAB — CMP14+EGFR
ALT: 13 IU/L (ref 0–44)
AST: 13 IU/L (ref 0–40)
Albumin: 4.5 g/dL (ref 3.9–4.9)
Alkaline Phosphatase: 75 IU/L (ref 47–123)
BUN/Creatinine Ratio: 14 (ref 10–24)
BUN: 12 mg/dL (ref 8–27)
Bilirubin Total: 0.6 mg/dL (ref 0.0–1.2)
CO2: 20 mmol/L (ref 20–29)
Calcium: 9.9 mg/dL (ref 8.6–10.2)
Chloride: 99 mmol/L (ref 96–106)
Creatinine, Ser: 0.88 mg/dL (ref 0.76–1.27)
Globulin, Total: 2.4 g/dL (ref 1.5–4.5)
Glucose: 240 mg/dL — ABNORMAL HIGH (ref 70–99)
Potassium: 5.2 mmol/L (ref 3.5–5.2)
Sodium: 136 mmol/L (ref 134–144)
Total Protein: 6.9 g/dL (ref 6.0–8.5)
eGFR: 93 mL/min/1.73

## 2024-06-19 LAB — HEMOGLOBIN A1C
Est. average glucose Bld gHb Est-mCnc: 263 mg/dL
Hgb A1c MFr Bld: 10.8 % — ABNORMAL HIGH (ref 4.8–5.6)

## 2024-06-19 LAB — LIPID PANEL
Chol/HDL Ratio: 4.4 ratio (ref 0.0–5.0)
Cholesterol, Total: 148 mg/dL (ref 100–199)
HDL: 34 mg/dL — ABNORMAL LOW
LDL Chol Calc (NIH): 75 mg/dL (ref 0–99)
Triglycerides: 236 mg/dL — ABNORMAL HIGH (ref 0–149)
VLDL Cholesterol Cal: 39 mg/dL (ref 5–40)

## 2024-06-19 LAB — MICROALBUMIN / CREATININE URINE RATIO
Creatinine, Urine: 71.2 mg/dL
Microalb/Creat Ratio: 466 mg/g{creat} — AB (ref 0–29)
Microalbumin, Urine: 332 ug/mL

## 2024-06-19 LAB — HEPATITIS C ANTIBODY: Hep C Virus Ab: NONREACTIVE

## 2024-09-09 ENCOUNTER — Ambulatory Visit: Admitting: Urology

## 2024-11-30 ENCOUNTER — Ambulatory Visit: Payer: Self-pay
